# Patient Record
Sex: Male | Born: 1955 | Race: Black or African American | Hispanic: No | Marital: Single | Smoking: Never smoker
Health system: Southern US, Community
[De-identification: ages and names within clinical notes are randomized; demographics above are authoritative.]

## PROBLEM LIST (undated history)

## (undated) DIAGNOSIS — F209 Schizophrenia, unspecified: Secondary | ICD-10-CM

## (undated) DIAGNOSIS — I10 Essential (primary) hypertension: Secondary | ICD-10-CM

## (undated) DIAGNOSIS — K5900 Constipation, unspecified: Secondary | ICD-10-CM

## (undated) HISTORY — PX: APPENDECTOMY: SHX54

## (undated) HISTORY — PX: COLONOSCOPY: SHX174

---

## 2008-09-21 ENCOUNTER — Encounter (INDEPENDENT_AMBULATORY_CARE_PROVIDER_SITE_OTHER): Payer: Self-pay | Admitting: *Deleted

## 2008-11-09 ENCOUNTER — Ambulatory Visit: Payer: Self-pay | Admitting: Internal Medicine

## 2008-11-09 DIAGNOSIS — K5909 Other constipation: Secondary | ICD-10-CM | POA: Insufficient documentation

## 2008-11-09 DIAGNOSIS — K921 Melena: Secondary | ICD-10-CM

## 2008-11-10 ENCOUNTER — Encounter: Payer: Self-pay | Admitting: Internal Medicine

## 2008-11-15 ENCOUNTER — Encounter: Payer: Self-pay | Admitting: Internal Medicine

## 2008-11-24 ENCOUNTER — Ambulatory Visit (HOSPITAL_COMMUNITY): Admission: RE | Admit: 2008-11-24 | Discharge: 2008-11-24 | Payer: Self-pay | Admitting: Internal Medicine

## 2008-11-24 ENCOUNTER — Ambulatory Visit: Payer: Self-pay | Admitting: Internal Medicine

## 2012-04-04 ENCOUNTER — Emergency Department (HOSPITAL_COMMUNITY): Payer: Medicare Other

## 2012-04-04 ENCOUNTER — Emergency Department (HOSPITAL_COMMUNITY)
Admission: EM | Admit: 2012-04-04 | Discharge: 2012-04-04 | Disposition: A | Discharge status: Home / Self Care | Payer: Medicare Other | Attending: Emergency Medicine | Admitting: Emergency Medicine

## 2012-04-04 ENCOUNTER — Encounter (HOSPITAL_COMMUNITY): Payer: Self-pay | Admitting: *Deleted

## 2012-04-04 DIAGNOSIS — Z79899 Other long term (current) drug therapy: Secondary | ICD-10-CM | POA: Insufficient documentation

## 2012-04-04 DIAGNOSIS — R112 Nausea with vomiting, unspecified: Secondary | ICD-10-CM | POA: Insufficient documentation

## 2012-04-04 DIAGNOSIS — K5900 Constipation, unspecified: Secondary | ICD-10-CM | POA: Insufficient documentation

## 2012-04-04 DIAGNOSIS — F209 Schizophrenia, unspecified: Secondary | ICD-10-CM | POA: Insufficient documentation

## 2012-04-04 DIAGNOSIS — R42 Dizziness and giddiness: Secondary | ICD-10-CM | POA: Insufficient documentation

## 2012-04-04 HISTORY — DX: Schizophrenia, unspecified: F20.9

## 2012-04-04 HISTORY — DX: Constipation, unspecified: K59.00

## 2012-04-04 LAB — CBC
HCT: 42.9 % (ref 39.0–52.0)
Hemoglobin: 14.8 g/dL (ref 13.0–17.0)
WBC: 13.3 10*3/uL — ABNORMAL HIGH (ref 4.0–10.5)

## 2012-04-04 LAB — URINALYSIS, ROUTINE W REFLEX MICROSCOPIC
Hgb urine dipstick: NEGATIVE
Leukocytes, UA: NEGATIVE
Nitrite: NEGATIVE
Protein, ur: NEGATIVE mg/dL
Specific Gravity, Urine: <1.005 — ABNORMAL LOW (ref 1.005–1.030)
Urobilinogen, UA: 0.2 mg/dL (ref 0.0–1.0)

## 2012-04-04 LAB — RAPID URINE DRUG SCREEN, HOSP PERFORMED
Barbiturates: NOT DETECTED
Opiates: NOT DETECTED
Tetrahydrocannabinol: NOT DETECTED

## 2012-04-04 LAB — DIFFERENTIAL
Basophils Absolute: 0 10*3/uL (ref 0.0–0.1)
Lymphocytes Relative: 9 % — ABNORMAL LOW (ref 12–46)
Monocytes Absolute: 1.2 10*3/uL — ABNORMAL HIGH (ref 0.1–1.0)
Monocytes Relative: 9 % (ref 3–12)
Neutro Abs: 10.9 10*3/uL — ABNORMAL HIGH (ref 1.7–7.7)

## 2012-04-04 LAB — COMPREHENSIVE METABOLIC PANEL
AST: 21 U/L (ref 0–37)
Alkaline Phosphatase: 63 U/L (ref 39–117)
CO2: 28 mEq/L (ref 19–32)
Chloride: 104 mEq/L (ref 96–112)
Creatinine, Ser: 0.86 mg/dL (ref 0.50–1.35)
GFR calc non Af Amer: >90 mL/min (ref >90)
Potassium: 3.6 mEq/L (ref 3.5–5.1)
Total Bilirubin: 0.4 mg/dL (ref 0.3–1.2)

## 2012-04-04 LAB — GLUCOSE, CAPILLARY: Glucose-Capillary: 95 mg/dL (ref 70–99)

## 2012-04-04 LAB — APTT: aPTT: 26 seconds (ref 24–37)

## 2012-04-04 MED ORDER — METOCLOPRAMIDE HCL 10 MG PO TABS: 10.0000 mg | ORAL_TABLET | Freq: Four times a day (QID) | ORAL | Status: AC | PRN

## 2012-04-04 MED ORDER — MECLIZINE HCL 25 MG PO TABS: 25.0000 mg | ORAL_TABLET | Freq: Four times a day (QID) | ORAL | Status: AC | PRN

## 2012-04-04 MED ORDER — ONDANSETRON HCL 4 MG/2ML IJ SOLN
4.0000 mg | Freq: Once | INTRAMUSCULAR | Status: AC
  Administered 2012-04-04: 4 mg via INTRAVENOUS
  Filled 2012-04-04: qty 2

## 2012-04-04 MED ORDER — METOCLOPRAMIDE HCL 5 MG/ML IJ SOLN
10.0000 mg | Freq: Once | INTRAMUSCULAR | Status: AC
  Administered 2012-04-04: 10 mg via INTRAVENOUS
  Filled 2012-04-04: qty 2

## 2012-04-04 MED ORDER — DIAZEPAM 5 MG PO TABS: 5.0000 mg | ORAL_TABLET | Freq: Four times a day (QID) | ORAL | Status: DC | PRN

## 2012-04-04 NOTE — ED Notes (Signed)
Pt presents to ER with dizziness, unable to maintain balance when standing, N/V when moving, denies diarrhea. No extremity weakness reported.

## 2012-04-04 NOTE — ED Provider Notes (Signed)
History     CSN: [id number]  Arrival date & time 04/04/12  1202   First MD Initiated Contact with Patient 04/04/12 1251      Chief Complaint  Patient presents with  . Dizziness  . Nausea  . Emesis    (Consider location/radiation/quality/duration/timing/severity/associated sxs/prior treatment) HPI This 57 year old has history of schizophrenia and lives in a group home, he was last known well at midnight overnight that he woke up at approximately 2:00 AM to the bathroom and realized he had new onset of gait ataxia with positional vertigo with nausea and has had multiple spells of nonbloody vomiting since he woke up this morning with this new ataxia and vertigo. He is no headache and no change in his poor vision. 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 He has no difficulty with speech or swallowing or understanding. He is no lateralizing weakness or numbness in his arms did not feel uncoordinated when he tries to walk he feels unsteady and has ataxic gait. He is no chest pain shortness breath abdominal pain. He is no trauma. He has not anxious depressed suicidal homicidal or hallucinating. His symptoms are moderately severe there is no treatment prior to arrival. Past Medical History  Diagnosis Date  . Schizophrenia   . Constipation     History reviewed. No pertinent past surgical history.  History reviewed. No pertinent family history.  History  Substance Use Topics  . Smoking status: Never Smoker   . Smokeless tobacco: Not on file  . Alcohol Use: No      Review of Systems 10 Systems reviewed and are negative for acute change except as noted in the HPI. Allergies  Prolixin; Haloperidol lactate; and Thioridazine hcl  Home Medications   Current Outpatient Rx  Name  Route  Sig  Dispense  Refill  . benztropine (COGENTIN) 0.5 MG tablet   Oral   Take 0.5 mg by mouth 2 (two) times daily.         Marland Kitchen docusate sodium (COLACE) 100 MG  capsule   Oral   Take 100 mg by mouth 2 (two) times daily.         Marland Kitchen loratadine (CLARITIN) 10 MG tablet   Oral   Take 10 mg by mouth at bedtime.         Marland Kitchen LORazepam (ATIVAN) 0.5 MG tablet   Oral   Take 0.5 mg by mouth at bedtime.         Marland Kitchen OLANZapine (ZYPREXA) 20 MG tablet   Oral   Take 20 mg by mouth at bedtime.         . diazepam (VALIUM) 5 MG tablet   Oral   Take 1 tablet (5 mg total) by mouth every 6 (six) hours as needed (vertigo).   10 tablet   0   . meclizine (ANTIVERT) 25 MG tablet   Oral   Take 1 tablet (25 mg total) by mouth every 6 (six) hours as needed for dizziness.   20 tablet   0   . metoCLOPramide (REGLAN) 10 MG tablet   Oral   Take 1 tablet (10 mg total) by mouth every 6 (six) hours as needed (nausea).   6 tablet   0     BP 145/86  Pulse 61  Temp(Src) 98.1 F (36.7 C) (Oral)  Ht 5' 5.5" (1.664 m)  Wt 140 lb (63.504 kg)  BMI 22.93 kg/m2  SpO2 99%  Physical Exam  Nursing note and vitals reviewed. Constitutional:  Awake, alert, nontoxic appearance with baseline speech for patient.  HENT:  Head: Atraumatic.  Mouth/Throat: No oropharyngeal exudate.  Eyes: EOM are normal. Pupils are equal, round, and reactive to light. Right eye exhibits no discharge. Left eye exhibits no discharge.  No nystagmus noted extraocular movements, patient is blind in his left eye at baseline, patient has poor vision in his right eye at baseline, patient has baseline ability to count fingers with his right eye, peripheral visual fields are baseline right central and right lateral to his right eye with poor left visual field using his right eye which is baseline according to the patient, he is blind in his left eye which is also baseline  Neck: Neck supple.  Cardiovascular: Normal rate and regular rhythm.   No murmur heard. Pulmonary/Chest: Effort normal and breath sounds normal. No stridor. No respiratory distress. He has no wheezes. He has no rales. He exhibits no  tenderness.  Abdominal: Soft. Bowel sounds are normal. He exhibits no mass. There is no tenderness. There is no rebound.  Musculoskeletal: He exhibits no tenderness.  Baseline ROM, moves extremities with no obvious new focal weakness.  Lymphadenopathy:    He has no cervical adenopathy.  Neurological: He is alert.  Awake, alert, cooperative and aware of situation; motor strength 5/5 bilaterally; sensation normal to light touch bilaterally; no facial asymmetry; tongue midline; major cranial nerves appear intact other than baseline left eye blindness and poor vision right eye; no pronator drift, normal finger to nose bilaterally, gait is ataxic  Skin: No rash noted.  Psychiatric: He has a normal mood and affect.    ED Course  Procedures (including critical care time) ECG: Sinus bradycardia, ventricular rate 54, left axis deviation, left anterior fascicular block, J-point ST elevation in lead V3, no comparison ECG available,  Patient feels much better and is able to walk almost back to baseline in the ED with MRI negative for stroke.  Labs Reviewed  CBC - Abnormal; Notable for the following:    WBC 13.3 (*)    All other components within normal limits  DIFFERENTIAL - Abnormal; Notable for the following:    Neutrophils Relative 82 (*)    Neutro Abs 10.9 (*)    Lymphocytes Relative 9 (*)    Monocytes Absolute 1.2 (*)    All other components within normal limits  COMPREHENSIVE METABOLIC PANEL - Abnormal; Notable for the following:    Glucose, Bld 141 (*)    All other components within normal limits  URINALYSIS, ROUTINE W REFLEX MICROSCOPIC - Abnormal; Notable for the following:    Color, Urine STRAW (*)    Specific Gravity, Urine <1.005 (*)    All other components within normal limits  ETHANOL  PROTIME-INR  APTT  TROPONIN I  URINE RAPID DRUG SCREEN (HOSP PERFORMED)  GLUCOSE, CAPILLARY   No results found.   1. Vertigo       MDM  Patient / Family / Caregiver informed of  clinical course, understand medical decision-making process, and agree with plan.    I doubt any other EMC precluding discharge at this time including, but not necessarily limited to the following:CVA.         Hurman Horn, MD 04/14/12 [phone]

## 2013-07-17 ENCOUNTER — Emergency Department: Payer: Self-pay | Admitting: Emergency Medicine

## 2013-07-17 LAB — COMPREHENSIVE METABOLIC PANEL
ALBUMIN: 3.6 g/dL (ref 3.4–5.0)
ALK PHOS: 82 U/L
ALT: 32 U/L (ref 12–78)
Anion Gap: 4 — ABNORMAL LOW (ref 7–16)
BILIRUBIN TOTAL: 0.7 mg/dL (ref 0.2–1.0)
BUN: 7 mg/dL (ref 7–18)
Calcium, Total: 8.8 mg/dL (ref 8.5–10.1)
Chloride: 109 mmol/L — ABNORMAL HIGH (ref 98–107)
Co2: 28 mmol/L (ref 21–32)
Creatinine: 1 mg/dL (ref 0.60–1.30)
EGFR (Non-African Amer.): >60
GLUCOSE: 104 mg/dL — AB (ref 65–99)
Osmolality: 280 (ref 275–301)
POTASSIUM: 3.9 mmol/L (ref 3.5–5.1)
SGOT(AST): 35 U/L (ref 15–37)
SODIUM: 141 mmol/L (ref 136–145)
Total Protein: 7.9 g/dL (ref 6.4–8.2)

## 2013-07-17 LAB — URINALYSIS, COMPLETE
BACTERIA: NONE SEEN
BLOOD: NEGATIVE
Bilirubin,UR: NEGATIVE
Glucose,UR: NEGATIVE mg/dL (ref 0–75)
Ketone: NEGATIVE
LEUKOCYTE ESTERASE: NEGATIVE
Nitrite: NEGATIVE
PH: 7 (ref 4.5–8.0)
Protein: NEGATIVE
RBC,UR: <1 /HPF (ref 0–5)
Specific Gravity: 1.011 (ref 1.003–1.030)
WBC UR: <1 /HPF (ref 0–5)

## 2013-07-17 LAB — DRUG SCREEN, URINE
AMPHETAMINES, UR SCREEN: NEGATIVE (ref ?–1000)
BENZODIAZEPINE, UR SCRN: NEGATIVE (ref ?–200)
Barbiturates, Ur Screen: NEGATIVE (ref ?–200)
CANNABINOID 50 NG, UR ~~LOC~~: NEGATIVE (ref ?–50)
Cocaine Metabolite,Ur ~~LOC~~: NEGATIVE (ref ?–300)
MDMA (ECSTASY) UR SCREEN: NEGATIVE (ref ?–500)
Methadone, Ur Screen: NEGATIVE (ref ?–300)
Opiate, Ur Screen: NEGATIVE (ref ?–300)
PHENCYCLIDINE (PCP) UR S: NEGATIVE (ref ?–25)
Tricyclic, Ur Screen: NEGATIVE (ref ?–1000)

## 2013-07-17 LAB — CBC
HCT: 45.6 % (ref 40.0–52.0)
HGB: 15.1 g/dL (ref 13.0–18.0)
MCH: 31.9 pg (ref 26.0–34.0)
MCHC: 33.1 g/dL (ref 32.0–36.0)
MCV: 96 fL (ref 80–100)
PLATELETS: 193 10*3/uL (ref 150–440)
RBC: 4.74 10*6/uL (ref 4.40–5.90)
RDW: 14.5 % (ref 11.5–14.5)
WBC: 6.9 10*3/uL (ref 3.8–10.6)

## 2013-07-17 LAB — SALICYLATE LEVEL: Salicylates, Serum: <1.7 mg/dL

## 2013-07-17 LAB — ACETAMINOPHEN LEVEL: Acetaminophen: <2 ug/mL

## 2013-07-17 LAB — ETHANOL: Ethanol %: <0.003 % (ref 0.000–0.080)
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
## 2017-04-03 ENCOUNTER — Ambulatory Visit (INDEPENDENT_AMBULATORY_CARE_PROVIDER_SITE_OTHER): Payer: Medicare Other | Admitting: Psychiatry

## 2017-04-03 ENCOUNTER — Encounter (INDEPENDENT_AMBULATORY_CARE_PROVIDER_SITE_OTHER): Payer: Self-pay

## 2017-04-03 ENCOUNTER — Telehealth (HOSPITAL_COMMUNITY): Payer: Self-pay | Admitting: *Deleted

## 2017-04-03 ENCOUNTER — Encounter (HOSPITAL_COMMUNITY): Payer: Self-pay | Admitting: Psychiatry

## 2017-04-03 VITALS — BP 124/77 | HR 70 | Ht 65.5 in | Wt 138.0 lb

## 2017-04-03 DIAGNOSIS — H548 Legal blindness, as defined in USA: Secondary | ICD-10-CM | POA: Diagnosis not present

## 2017-04-03 DIAGNOSIS — F209 Schizophrenia, unspecified: Secondary | ICD-10-CM

## 2017-04-03 MED ORDER — OLANZAPINE 20 MG PO TABS: 40.0000 mg | ORAL_TABLET | Freq: Every day | ORAL | 0 refills | Status: DC

## 2017-04-03 MED ORDER — LORAZEPAM 1 MG PO TABS: 1.0000 mg | ORAL_TABLET | Freq: Two times a day (BID) | ORAL | 0 refills | Status: DC

## 2017-04-03 MED ORDER — BENZTROPINE MESYLATE 0.5 MG PO TABS: 0.5000 mg | ORAL_TABLET | Freq: Every day | ORAL | 0 refills | Status: DC

## 2017-04-03 NOTE — Telephone Encounter (Signed)
Thanks for the update. Advise them to continue the current dose.

## 2017-04-03 NOTE — Telephone Encounter (Signed)
Dr Modesta Messing Caregiver called stating that the Zyprexa is 40 mg he takes 2 -- 20 mg @ night

## 2017-04-03 NOTE — Patient Instructions (Signed)
1. Continue olanzapine 40 mg at night  2. Continue cogentin 0.5 mg at night 3. Continue lorazepam 1 mg twice a day  4. Return to clinic in three months for 30 mins

## 2017-04-09 ENCOUNTER — Telehealth (HOSPITAL_COMMUNITY): Payer: Self-pay | Admitting: *Deleted

## 2017-04-09 NOTE — Telephone Encounter (Signed)
Prior authorization for Olanzapine received. Submitted online with cover my meds. Awaiting response.

## 2017-04-11 ENCOUNTER — Telehealth (HOSPITAL_COMMUNITY): Payer: Self-pay | Admitting: *Deleted

## 2017-04-11 NOTE — Telephone Encounter (Signed)
Received letter from [facility] for approval of Olanzapine from 02/19/17-04/09/18.
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
## 2017-07-02 ENCOUNTER — Ambulatory Visit (INDEPENDENT_AMBULATORY_CARE_PROVIDER_SITE_OTHER): Payer: Medicare Other | Admitting: Psychiatry

## 2017-07-02 ENCOUNTER — Encounter (HOSPITAL_COMMUNITY): Payer: Self-pay | Admitting: Psychiatry

## 2017-07-02 VITALS — BP 155/93 | HR 90 | Ht 65.5 in | Wt 136.0 lb

## 2017-07-02 DIAGNOSIS — H548 Legal blindness, as defined in USA: Secondary | ICD-10-CM

## 2017-07-02 DIAGNOSIS — Z560 Unemployment, unspecified: Secondary | ICD-10-CM

## 2017-07-02 DIAGNOSIS — F209 Schizophrenia, unspecified: Secondary | ICD-10-CM

## 2017-07-02 DIAGNOSIS — Z811 Family history of alcohol abuse and dependence: Secondary | ICD-10-CM | POA: Diagnosis not present

## 2017-07-02 MED ORDER — BENZTROPINE MESYLATE 0.5 MG PO TABS: 0.5000 mg | ORAL_TABLET | Freq: Every day | ORAL | 0 refills | Status: DC

## 2017-07-02 MED ORDER — LORAZEPAM 1 MG PO TABS: 1.0000 mg | ORAL_TABLET | Freq: Two times a day (BID) | ORAL | 2 refills | Status: DC

## 2017-07-02 MED ORDER — OLANZAPINE 20 MG PO TABS: 40.0000 mg | ORAL_TABLET | Freq: Every day | ORAL | 0 refills | Status: DC

## 2017-07-02 NOTE — Patient Instructions (Addendum)
1. Continue olanzapine 40 mg at night  2. Continue cogentin 0.5 mg at night 3. Continue lorazepam 1 mg twice a day  4. Return to clinic in three months for 30 mins (Please be accompanied by Ms. Long or the staff who takes care of Nicholas Moyer)

## 2017-07-19 ENCOUNTER — Other Ambulatory Visit: Payer: Self-pay

## 2017-07-19 DIAGNOSIS — Z1211 Encounter for screening for malignant neoplasm of colon: Secondary | ICD-10-CM

## 2017-08-05 ENCOUNTER — Ambulatory Visit: Payer: Medicare Other | Admitting: Certified Registered Nurse Anesthetist

## 2017-08-05 ENCOUNTER — Ambulatory Visit
Admission: RE | Admit: 2017-08-05 | Discharge: 2017-08-05 | Disposition: A | Discharge status: Home / Self Care | Payer: Medicare Other | Source: Ambulatory Visit | Attending: Gastroenterology | Admitting: Gastroenterology

## 2017-08-05 ENCOUNTER — Encounter
Admission: RE | Disposition: A | Discharge status: Home / Self Care | Payer: Self-pay | Source: Ambulatory Visit | Attending: Gastroenterology

## 2017-08-05 ENCOUNTER — Other Ambulatory Visit: Payer: Self-pay

## 2017-08-05 ENCOUNTER — Encounter: Payer: Self-pay | Admitting: *Deleted

## 2017-08-05 DIAGNOSIS — D123 Benign neoplasm of transverse colon: Secondary | ICD-10-CM | POA: Insufficient documentation

## 2017-08-05 DIAGNOSIS — F209 Schizophrenia, unspecified: Secondary | ICD-10-CM | POA: Insufficient documentation

## 2017-08-05 DIAGNOSIS — K5900 Constipation, unspecified: Secondary | ICD-10-CM | POA: Diagnosis not present

## 2017-08-05 DIAGNOSIS — K648 Other hemorrhoids: Secondary | ICD-10-CM | POA: Insufficient documentation

## 2017-08-05 DIAGNOSIS — Z79899 Other long term (current) drug therapy: Secondary | ICD-10-CM | POA: Insufficient documentation

## 2017-08-05 DIAGNOSIS — D122 Benign neoplasm of ascending colon: Secondary | ICD-10-CM | POA: Insufficient documentation

## 2017-08-05 DIAGNOSIS — Z1211 Encounter for screening for malignant neoplasm of colon: Secondary | ICD-10-CM | POA: Diagnosis present

## 2017-08-05 HISTORY — PX: COLONOSCOPY WITH PROPOFOL: SHX5780

## 2017-08-05 LAB — URINE DRUG SCREEN, QUALITATIVE (ARMC ONLY)
Amphetamines, Ur Screen: NOT DETECTED
BENZODIAZEPINE, UR SCRN: POSITIVE — AB
Cannabinoid 50 Ng, Ur ~~LOC~~: NOT DETECTED
Cocaine Metabolite,Ur ~~LOC~~: NOT DETECTED
MDMA (Ecstasy)Ur Screen: NOT DETECTED
METHADONE SCREEN, URINE: NOT DETECTED
Opiate, Ur Screen: NOT DETECTED
PHENCYCLIDINE (PCP) UR S: NOT DETECTED
Tricyclic, Ur Screen: NOT DETECTED

## 2017-08-05 SURGERY — COLONOSCOPY WITH PROPOFOL
Anesthesia: General

## 2017-08-05 MED ORDER — LIDOCAINE HCL (CARDIAC) PF 100 MG/5ML IV SOSY
PREFILLED_SYRINGE | INTRAVENOUS | Status: DC | PRN
  Administered 2017-08-05: 50 mg via INTRAVENOUS

## 2017-08-05 MED ORDER — PROPOFOL 10 MG/ML IV BOLUS
INTRAVENOUS | Status: DC | PRN
  Administered 2017-08-05: 60 mg via INTRAVENOUS

## 2017-08-05 MED ORDER — PHENYLEPHRINE HCL 10 MG/ML IJ SOLN
INTRAMUSCULAR | Status: DC | PRN
  Administered 2017-08-05: 100 ug via INTRAVENOUS

## 2017-08-05 MED ORDER — SODIUM CHLORIDE 0.9 % IV SOLN
INTRAVENOUS | Status: DC
  Administered 2017-08-05 (×2): via INTRAVENOUS

## 2017-08-05 MED ORDER — PROPOFOL 500 MG/50ML IV EMUL
INTRAVENOUS | Status: DC | PRN
  Administered 2017-08-05: 185 ug/kg/min via INTRAVENOUS

## 2017-08-05 MED ORDER — PROPOFOL 500 MG/50ML IV EMUL
INTRAVENOUS | Status: AC
  Filled 2017-08-05: qty 50

## 2017-08-05 NOTE — Anesthesia Preprocedure Evaluation (Signed)
Anesthesia Evaluation  Patient identified by MRN, date of birth, ID band Patient awake    Reviewed: Allergy & Precautions, H&P , NPO status , Patient's Chart, lab work & pertinent test results, reviewed documented beta blocker date and time   History of Anesthesia Complications Negative for: history of anesthetic complications  Airway Mallampati: I  TM Distance: >3 FB Neck ROM: full    Dental  (+) Dental Advidsory Given, Missing, Poor Dentition   Pulmonary neg pulmonary ROS,           Cardiovascular Exercise Tolerance: Good negative cardio ROS       Neuro/Psych PSYCHIATRIC DISORDERS Schizophrenia negative neurological ROS     GI/Hepatic negative GI ROS, Neg liver ROS,   Endo/Other  negative endocrine ROS  Renal/GU negative Renal ROS  negative genitourinary   Musculoskeletal   Abdominal   Peds  Hematology negative hematology ROS (+)   Anesthesia Other Findings Past Medical History: No date: Constipation No date: Schizophrenia ([place])   Reproductive/Obstetrics negative OB ROS                             Anesthesia Physical Anesthesia Plan  ASA: II  Anesthesia Plan: General   Post-op Pain Management:    Induction: Intravenous  PONV Risk Score and Plan: 2 and Propofol infusion  Airway Management Planned: Nasal Cannula  Additional Equipment:   Intra-op Plan:   Post-operative Plan:   Informed Consent: I have reviewed the patients History and Physical, chart, labs and discussed the procedure including the risks, benefits and alternatives for the proposed anesthesia with the patient or authorized representative who has indicated his/her understanding and acceptance.   Dental Advisory Given  Plan Discussed with: Anesthesiologist, CRNA and Surgeon  Anesthesia Plan Comments:         Anesthesia Quick Evaluation

## 2017-08-05 NOTE — Anesthesia Procedure Notes (Signed)
Date/Time: 08/05/2017 11:54 AM Performed by: Johnna Acosta, CRNA Pre-anesthesia Checklist: Patient identified, Emergency Drugs available, Suction available, Patient being monitored and Timeout performed Patient Re-evaluated:Patient Re-evaluated prior to induction Oxygen Delivery Method: Nasal cannula Preoxygenation: Pre-oxygenation with 100% oxygen

## 2017-08-05 NOTE — H&P (Signed)
Nicholas Darby, MD [address]  [place]  [place], [place] [address]  Main: [phone]  Fax: [phone] Pager: [phone]  Primary Care Physician:  Carlos American, FNP Primary Gastroenterologist:  Dr. Cephas Moyer  Pre-Procedure History & Physical: HPI:  Nicholas Moyer is a 62 y.o. male is here for an colonoscopy.   Past Medical History:  Diagnosis Date  . Constipation   . Schizophrenia [facility])     Past Surgical History:  Procedure Laterality Date  . APPENDECTOMY    . COLONOSCOPY      Prior to Admission medications   Medication Sig Start Date End Date Taking? Authorizing Provider  benztropine (COGENTIN) 0.5 MG tablet Take 1 tablet (0.5 mg total) by mouth at bedtime. 07/02/17  Yes Norman Clay, MD  docusate sodium (COLACE) 100 MG capsule Take 100 mg by mouth 2 (two) times daily.   Yes [provider]  loratadine (CLARITIN) 10 MG tablet Take 10 mg by mouth daily.    Yes [provider]  LORazepam (ATIVAN) 1 MG tablet Take 1 tablet (1 mg total) by mouth 2 (two) times daily. 07/28/17  Yes Norman Clay, MD  meclizine (ANTIVERT) 25 MG tablet Take 1 tablet (25 mg total) by mouth every 6 (six) hours as needed for dizziness. 04/04/12  Yes Riki Altes, MD  metoCLOPramide (REGLAN) 10 MG tablet Take 1 tablet (10 mg total) by mouth every 6 (six) hours as needed (nausea). 04/04/12  Yes Riki Altes, MD  OLANZapine (ZYPREXA) 20 MG tablet Take 2 tablets (40 mg total) by mouth at bedtime. 07/02/17  Yes Hisada, Elie Goody, MD  polyethylene glycol (MIRALAX / GLYCOLAX) packet Take 17 g by mouth daily.   Yes [provider]    Allergies as of 07/19/2017 - Review Complete 07/02/2017  Allergen Reaction Noted  . Prolixin [fluphenazine] Other (See Comments) 04/04/2012  . Haloperidol lactate Other (See Comments)   . Thioridazine hcl Other (See Comments)     Family History  Problem Relation Age of Onset  . Alcohol abuse Maternal Aunt     Social History    Socioeconomic History  . Marital status: Single    Spouse name: Not on file  . Number of children: Not on file  . Years of education: Not on file  . Highest education level: Not on file  Occupational History  . Not on file  Social Needs  . Financial resource strain: Not on file  . Food insecurity:    Worry: Not on file    Inability: Not on file  . Transportation needs:    Medical: Not on file    Non-medical: Not on file  Tobacco Use  . Smoking status: Never Smoker  . Smokeless tobacco: Never Used  Substance and Sexual Activity  . Alcohol use: Not Currently  . Drug use: Not Currently  . Sexual activity: Never  Lifestyle  . Physical activity:    Days per week: Not on file    Minutes per session: Not on file  . Stress: Not on file  Relationships  . Social connections:    Talks on phone: Not on file    Gets together: Not on file    Attends religious service: Not on file    Active member of club or organization: Not on file    Attends meetings of clubs or organizations: Not on file    Relationship status: Not on file  . Intimate partner violence:    Fear of current or ex partner: Not  on file    Emotionally abused: Not on file    Physically abused: Not on file    Forced sexual activity: Not on file  Other Topics Concern  . Not on file  Social History Narrative  . Not on file    Review of Systems: See HPI, otherwise negative ROS  Physical Exam: BP 132/86   Pulse 78   Temp (!) 96.4 F (35.8 C)   Resp 16   Ht 5' 5.5" (1.664 m)   Wt 145 lb (65.8 kg)   SpO2 100%   BMI 23.76 kg/m  General:   Alert,  pleasant and cooperative in NAD Head:  Normocephalic and atraumatic. Neck:  Supple; no masses or thyromegaly. Lungs:  Clear throughout to auscultation.    Heart:  Regular rate and rhythm. Abdomen:  Soft, nontender and nondistended. Normal bowel sounds, without guarding, and without rebound.   Neurologic:  Alert and  oriented x4;  grossly normal  neurologically.  Impression/Plan: Nicholas Moyer is here for an colonoscopy to be performed for colon cancer screening  Risks, benefits, limitations, and alternatives regarding  colonoscopy have been reviewed with the patient.  Questions have been answered.  All parties agreeable.   Sherri Sear, MD  08/05/2017, 11:39 AM

## 2017-08-05 NOTE — Anesthesia Post-op Follow-up Note (Signed)
Anesthesia QCDR form completed.        

## 2017-08-05 NOTE — Anesthesia Postprocedure Evaluation (Signed)
Anesthesia Post Note  Patient: Nicholas Moyer  Procedure(s) Performed: COLONOSCOPY WITH PROPOFOL (N/A )  Patient location during evaluation: Endoscopy Anesthesia Type: General Level of consciousness: awake and alert Pain management: pain level controlled Vital Signs Assessment: post-procedure vital signs reviewed and stable Respiratory status: spontaneous breathing, nonlabored ventilation, respiratory function stable and patient connected to nasal cannula oxygen Cardiovascular status: blood pressure returned to baseline and stable Postop Assessment: no apparent nausea or vomiting Anesthetic complications: no     Last Vitals:  Vitals:   08/05/17 1236 08/05/17 1246  BP: 125/77 (!) 148/90  Pulse: 71 76  Resp: (!) 21 17  Temp:    SpO2: 100% 100%    Last Pain:  Vitals:   08/05/17 1246  TempSrc:   PainSc: 0-No pain                 Martha Clan

## 2017-08-05 NOTE — Op Note (Addendum)
[facility] Gastroenterology Patient Name: Nicholas Moyer Procedure Date: 08/05/2017 11:52 AM MRN: [id number] Account #: [id number] Date of Birth: [birth year] Admit Type: Outpatient Age: 62 Room: [facility] ENDO ROOM 2 Gender: Male Note Status: Finalized Procedure:            Colonoscopy Indications:          Screening for colorectal malignant neoplasm Providers:            Lin Landsman MD, MD Referring MD:         No Local Md, MD (Referring MD) Medicines:            Monitored Anesthesia Care Complications:        No immediate complications. Estimated blood loss: None. Procedure:            Pre-Anesthesia Assessment:                       - Prior to the procedure, a History and Physical was                        performed, and patient medications and allergies were                        reviewed. The patient is competent. The risks and                        benefits of the procedure and the sedation options and                        risks were discussed with the patient. All questions                        were answered and informed consent was obtained.                        Patient identification and proposed procedure were                        verified by the physician, the nurse, the                        anesthesiologist, the anesthetist and the technician in                        the pre-procedure area in the procedure room in the                        endoscopy suite. Mental Status Examination: alert and                        oriented. Airway Examination: normal oropharyngeal                        airway and neck mobility. Respiratory Examination:                        clear to auscultation. CV Examination: normal.                        Prophylactic Antibiotics: The patient does not require  prophylactic antibiotics. Prior Anticoagulants: The                        patient has taken no previous anticoagulant or               antiplatelet agents. ASA Grade Assessment: II - A                        patient with mild systemic disease. After reviewing the                        risks and benefits, the patient was deemed in                        satisfactory condition to undergo the procedure. The                        anesthesia plan was to use monitored anesthesia care                        (MAC). Immediately prior to administration of                        medications, the patient was re-assessed for adequacy                        to receive sedatives. The heart rate, respiratory rate,                        oxygen saturations, blood pressure, adequacy of                        pulmonary ventilation, and response to care were                        monitored throughout the procedure. The physical status                        of the patient was re-assessed after the procedure.                       After obtaining informed consent, the colonoscope was                        passed under direct vision. Throughout the procedure,                        the patient's blood pressure, pulse, and oxygen                        saturations were monitored continuously. The                        Colonoscope was introduced through the anus and                        advanced to the the cecum, identified by appendiceal                        orifice and ileocecal valve. The colonoscopy was  performed without difficulty. The patient tolerated the                        procedure well. The quality of the bowel preparation                        was evaluated using the BBPS [facility] Bowel Preparation                        Scale) with scores of: Right Colon = 3, Transverse                        Colon = 3 and Left Colon = 3 (entire mucosa seen well                        with no residual staining, small fragments of stool or                        opaque liquid). The total BBPS score equals  9. Findings:      The perianal and digital rectal examinations were normal. Pertinent       negatives include normal sphincter tone and no palpable rectal lesions.      A 7 mm polyp was found in the ascending colon. The polyp was sessile.       The polyp was removed with a hot snare. Resection and retrieval were       complete.      A 20 mm polyp was found in the transverse colon. The polyp was       multi-lobulated and pedunculated. The polyp was removed with a hot       snare. Resection and retrieval were complete.      Non-bleeding internal hemorrhoids were found during retroflexion. The       hemorrhoids were medium-sized.      The exam was otherwise without abnormality. Impression:           - One 7 mm polyp in the ascending colon, removed with a                        hot snare. Complete resection and retrieved                       - One 20 mm polyp in the transverse colon, removed with                        a hot snare. Resected and retrieved.                       - Non-bleeding internal hemorrhoids.                       - The examination was otherwise normal. Recommendation:       - Discharge patient to home (with escort).                       - Resume regular diet today.                       - Continue present medications.                       -  Await pathology results.                       - Repeat colonoscopy in 3 years for surveillance. Procedure Code(s):    --- Professional ---                       [phone], Colonoscopy, flexible; with removal of tumor(s),                        polyp(s), or other lesion(s) by snare technique Diagnosis Code(s):    --- Professional ---                       Z12.11, Encounter for screening for malignant neoplasm                        of colon                       D12.2, Benign neoplasm of ascending colon                       D12.3, Benign neoplasm of transverse colon (hepatic                        flexure or splenic flexure)                        K64.8, Other hemorrhoids CPT copyright 2017 American Medical Association. All rights reserved. The codes documented in this report are preliminary and upon coder review may  be revised to meet current compliance requirements. Dr. Ulyess Mort Lin Landsman MD, MD 08/05/2017 12:25:28 PM This report has been signed electronically. Number of Addenda: 0 Note Initiated On: 08/05/2017 11:52 AM Scope Withdrawal Time: 0 hours 18 minutes 46 seconds  Total Procedure Duration: 0 hours 21 minutes 49 seconds       [facility]

## 2017-08-05 NOTE — Transfer of Care (Signed)
Immediate Anesthesia Transfer of Care Note  Patient: Gurjot Premo  Procedure(s) Performed: COLONOSCOPY WITH PROPOFOL (N/A )  Patient Location: PACU  Anesthesia Type:General  Level of Consciousness: sedated  Airway & Oxygen Therapy: Patient Spontanous Breathing and Patient connected to nasal cannula oxygen  Post-op Assessment: Report given to RN and Post -op Vital signs reviewed and stable  Post vital signs: Reviewed and stable  Last Vitals:  Vitals Value Taken Time  BP 102/73 08/05/2017 12:27 PM  Temp 36.1 C 08/05/2017 12:27 PM  Pulse 88 08/05/2017 12:27 PM  Resp 12 08/05/2017 12:27 PM  SpO2 99 % 08/05/2017 12:27 PM  Vitals shown include unvalidated device data.  Last Pain:  Vitals:   08/05/17 1227  TempSrc: Tympanic  PainSc:          Complications: No apparent anesthesia complications

## 2017-08-06 ENCOUNTER — Encounter: Payer: Self-pay | Admitting: Gastroenterology

## 2017-08-06 LAB — SURGICAL PATHOLOGY

## 2017-08-07 ENCOUNTER — Encounter: Payer: Self-pay | Admitting: Gastroenterology

## 2017-09-23 NOTE — Progress Notes (Signed)
BH MD/PA/NP OP Progress Note  10/07/2017 11:01 AM Nicholas Moyer  MRN:  [id number]  Chief Complaint:  Chief Complaint    Follow-up; Schizophrenia     HPI:  Patient presents for follow-up appointment for schizophrenia.  He states that he had decreased vision on his right eye and his left eye is blind.  He has follow-up appointment with his ophthalmologist.  He states that he has been doing well otherwise. He got contacted by his one of his sisters in [place]. He talks with her every few months. He enjoys writing songs and stories.  He states in the room most of the time.  He reports good relationship was Konrad Dolores, his roommate.  He denies insomnia.  He denies feeling depressed.  He has good energy.  He denies SI, HI, AH, VH. He denies feeling anxious. He denies panic attacks. He denies paranoia.  He denies ideas of reference.   Per letter from Ms.  Elza Rafter He has been doing fine. No new complaints except constipation. He enjoys writing songs, stories.   Wt Readings from Last 3 Encounters:  10/07/17 137 lb (62.1 kg)  08/05/17 145 lb (65.8 kg)  07/02/17 136 lb (61.7 kg)   Per PMP,  Lorazepam filled on 10/03/2017    Visit Diagnosis:    ICD-10-CM   1. Schizophrenia, unspecified type ([place]) F20.9     Past Psychiatric History: Please see initial evaluation for full details. I have reviewed the history. No updates at this time.     Past Medical History:  Past Medical History:  Diagnosis Date  . Constipation   . Schizophrenia [facility])     Past Surgical History:  Procedure Laterality Date  . APPENDECTOMY    . COLONOSCOPY    . COLONOSCOPY WITH PROPOFOL N/A 08/05/2017   Procedure: COLONOSCOPY WITH PROPOFOL;  Surgeon: Lin Landsman, MD;  Location: [facility] ENDOSCOPY;  Service: Gastroenterology;  Laterality: N/A;    Family Psychiatric History: Please see initial evaluation for full details. I have reviewed the history. No updates at this time.     Family History:  Family History   Problem Relation Age of Onset  . Alcohol abuse Maternal Aunt     Social History:  Social History   Socioeconomic History  . Marital status: Single    Spouse name: Not on file  . Number of children: Not on file  . Years of education: Not on file  . Highest education level: Not on file  Occupational History  . Not on file  Social Needs  . Financial resource strain: Not on file  . Food insecurity:    Worry: Not on file    Inability: Not on file  . Transportation needs:    Medical: Not on file    Non-medical: Not on file  Tobacco Use  . Smoking status: Never Smoker  . Smokeless tobacco: Never Used  Substance and Sexual Activity  . Alcohol use: Not Currently  . Drug use: Not Currently  . Sexual activity: Never  Lifestyle  . Physical activity:    Days per week: Not on file    Minutes per session: Not on file  . Stress: Not on file  Relationships  . Social connections:    Talks on phone: Not on file    Gets together: Not on file    Attends religious service: Not on file    Active member of club or organization: Not on file    Attends meetings of clubs or organizations: Not  on file    Relationship status: Not on file  Other Topics Concern  . Not on file  Social History Narrative  . Not on file    Allergies:  Allergies  Allergen Reactions  . Prolixin [Fluphenazine] Other (See Comments)    Pt states his muscles lock up  . Haloperidol Lactate Other (See Comments)    Muscles lock up.  . Thioridazine Hcl Other (See Comments)    Induces heart attack.    Metabolic Disorder Labs: No results found for: HGBA1C, MPG No results found for: PROLACTIN No results found for: CHOL, TRIG, HDL, CHOLHDL, VLDL, LDLCALC No results found for: TSH  Therapeutic Level Labs: No results found for: LITHIUM No results found for: VALPROATE No components found for:  CBMZ  Current Medications: Current Outpatient Medications  Medication Sig Dispense Refill  . docusate sodium (COLACE)  100 MG capsule Take 100 mg by mouth 2 (two) times daily.    Marland Kitchen loratadine (CLARITIN) 10 MG tablet Take 10 mg by mouth daily.     Derrill Memo ON 11/03/2017] LORazepam (ATIVAN) 1 MG tablet Take 1 tablet (1 mg total) by mouth 2 (two) times daily. 60 tablet 2  . OLANZapine (ZYPREXA) 20 MG tablet Take 2 tablets (40 mg total) by mouth at bedtime. 180 tablet 0  . polyethylene glycol (MIRALAX / GLYCOLAX) packet Take 17 g by mouth daily.    . meclizine (ANTIVERT) 25 MG tablet Take 1 tablet (25 mg total) by mouth every 6 (six) hours as needed for dizziness. (Patient not taking: Reported on 10/07/2017) 20 tablet 0  . metoCLOPramide (REGLAN) 10 MG tablet Take 1 tablet (10 mg total) by mouth every 6 (six) hours as needed (nausea). (Patient not taking: Reported on 10/07/2017) 6 tablet 0   No current facility-administered medications for this visit.      Musculoskeletal: Strength & Muscle Tone: within normal limits Gait & Station: unsteady due to visual loss Patient leans: N/A  Psychiatric Specialty Exam: Review of Systems  Eyes:       Decreased vision on bilateral eyes (left blind per patient)  Psychiatric/Behavioral: Negative for depression, hallucinations, memory loss, substance abuse and suicidal ideas. The patient is not nervous/anxious and does not have insomnia.   All other systems reviewed and are negative.   Blood pressure 128/73, pulse 60, height 5' 5.5" (1.664 m), weight 137 lb (62.1 kg), SpO2 97 %.Body mass index is 22.45 kg/m.  General Appearance: Fairly Groomed  Eye Contact:  Fair  Speech:  Clear and Coherent  Volume:  Normal  Mood:  "good"  Affect:  Appropriate and Restricted  Thought Process:  Coherent  Orientation:  Full (Time, Place, and Person)  Thought Content: Logical , concrete  Suicidal Thoughts:  No  Homicidal Thoughts:  No  Memory:  Immediate;   Good  Judgement:  Fair  Insight:  Shallow  Psychomotor Activity:  Normal  Concentration:  Concentration: Good and Attention Span:  Good  Recall:  Good  Fund of Knowledge: Good  Language: Good  Akathisia:  No  Handed:  Right  AIMS (if indicated): not done  Assets:  Communication Skills Desire for Improvement  ADL's:  Intact  Cognition: WNL  Sleep:  good   Screenings:   Assessment and Plan:  Diontre Harps is a 62 y.o. year old male with a history of schizophrenia, legally blind, who presents for follow up appointment for Schizophrenia, unspecified type ([place])  # Schizophrenia Patient is calm through the entire interview and demonstrates linear thought  process, which has been consistent since the initial visit.  Collateral from the staff reports no concern for his behavior.  Will continue olanzapine to target schizophrenia. Discussed risks, including, but not limited to metabolic side effect. Will consider tapering down of this medication at the next visit given he has been on higher dose than recommended. Will discontinue cogentin to avoid polypharmacy. Will continue lorazepam for anxiety. Discussed risk of drowsiness, oversedation. Discussed behavioral activation.   Plan 1. Continue olanzapine 40 mg at night  2. Discontinue cogentin 3. Continue lorazepam 1 mg twice a day  4. Return to clinic in three months for 15 mins  The patient demonstrates the following risk factors for suicide: Chronic risk factors for suicide include:psychiatric disorder ofschizophrenia. Acute risk factorsfor suicide include: unemployment. Protective factorsfor this patient include: positive social support. Considering these factors, the overall suicide risk at this point appears to below. Patientisappropriate for outpatient follow up.  The duration of this appointment visit was 30 minutes of face-to-face time with the patient.  Greater than 50% of this time was spent in counseling, explanation of  diagnosis, planning of further management, and coordination of care.  Norman Clay, MD 10/07/2017, 11:01 AM

## 2017-10-07 ENCOUNTER — Encounter (HOSPITAL_COMMUNITY): Payer: Self-pay | Admitting: Psychiatry

## 2017-10-07 ENCOUNTER — Ambulatory Visit (INDEPENDENT_AMBULATORY_CARE_PROVIDER_SITE_OTHER): Payer: Medicare Other | Admitting: Psychiatry

## 2017-10-07 VITALS — BP 128/73 | HR 60 | Ht 65.5 in | Wt 137.0 lb

## 2017-10-07 DIAGNOSIS — Z811 Family history of alcohol abuse and dependence: Secondary | ICD-10-CM | POA: Diagnosis not present

## 2017-10-07 DIAGNOSIS — F209 Schizophrenia, unspecified: Secondary | ICD-10-CM

## 2017-10-07 MED ORDER — LORAZEPAM 1 MG PO TABS: 1.0000 mg | ORAL_TABLET | Freq: Two times a day (BID) | ORAL | 2 refills | Status: DC

## 2017-10-07 MED ORDER — OLANZAPINE 20 MG PO TABS: 40.0000 mg | ORAL_TABLET | Freq: Every day | ORAL | 0 refills | Status: DC

## 2017-10-07 NOTE — Patient Instructions (Addendum)
1. Continue olanzapine 40 mg at night  2. Discontinue cogentin 3. Continue lorazepam 1 mg twice a day  4. Return to clinic in three months for 15 mins (appreciate a note from Ms. Long or the staff in regards to how Nicholas Moyer is doing until the next visit)

## 2017-12-31 NOTE — Progress Notes (Signed)
[place] MD/PA/NP OP Progress Note  01/07/2018 10:25 AM Nicholas Moyer  MRN:  [id number]  Chief Complaint:  Chief Complaint    Follow-up; Schizophrenia     HPI:  Patient presents for follow-up appointment for schizophrenia.  He states that he has been feeling pretty good.  He has been trying to come out with the next all he to write on the back.  He denies feeling depressed, stating that he is busy with writing.  When he is asked about the reason for wearing his hood, he states that his neck is cold.  Although he occasionally feels that somebody might get him, he is not bothered by it.  He reports good sleep. When Lelon Frohlich told him that he wakes up at night, he states that he wakes up with sweat due to issues with fan. He has good energy.  He denies SI, HI, AH, VH.  He denies feeling anxious.  He denies ideas of reference or any paranoia. Of note, at the end of the interview, he asks colonoscopy to be repeated every year rather than waiting for another three years as he is concerned of cancer; he is recommended to discuss with his PCP.  Ms. Laverta Baltimore presents to the interview.  She head from his roommate that Keefe wakes up at around 1 am and has difficulty with sleep. No other safety concern.  Wt Readings from Last 3 Encounters:  01/07/18 142 lb (64.4 kg)  10/07/17 137 lb (62.1 kg)  08/05/17 145 lb (65.8 kg)    Wt Readings from Last 3 Encounters:  01/07/18 142 lb (64.4 kg)  10/07/17 137 lb (62.1 kg)  08/05/17 145 lb (65.8 kg)    I have utilized the [place] Controlled Substances Reporting System (PMP AWARxE) to confirm adherence regarding the patient's medication. My review reveals appropriate prescription fills.   Visit Diagnosis:    ICD-10-CM   1. Schizophrenia, unspecified type ([place]) F20.9     Past Psychiatric History: Please see initial evaluation for full details. I have reviewed the history. No updates at this time.     Past Medical History:  Past Medical History:  Diagnosis Date  .  Constipation   . Schizophrenia [facility])     Past Surgical History:  Procedure Laterality Date  . APPENDECTOMY    . COLONOSCOPY    . COLONOSCOPY WITH PROPOFOL N/A 08/05/2017   Procedure: COLONOSCOPY WITH PROPOFOL;  Surgeon: Lin Landsman, MD;  Location: [facility] ENDOSCOPY;  Service: Gastroenterology;  Laterality: N/A;    Family Psychiatric History: Please see initial evaluation for full details. I have reviewed the history. No updates at this time.   Family History:  Family History  Problem Relation Age of Onset  . Alcohol abuse Maternal Aunt     Social History:  Social History   Socioeconomic History  . Marital status: Single    Spouse name: Not on file  . Number of children: Not on file  . Years of education: Not on file  . Highest education level: Not on file  Occupational History  . Not on file  Social Needs  . Financial resource strain: Not on file  . Food insecurity:    Worry: Not on file    Inability: Not on file  . Transportation needs:    Medical: Not on file    Non-medical: Not on file  Tobacco Use  . Smoking status: Never Smoker  . Smokeless tobacco: Never Used  Substance and Sexual Activity  . Alcohol use: Not Currently  .  Drug use: Not Currently  . Sexual activity: Never  Lifestyle  . Physical activity:    Days per week: Not on file    Minutes per session: Not on file  . Stress: Not on file  Relationships  . Social connections:    Talks on phone: Not on file    Gets together: Not on file    Attends religious service: Not on file    Active member of club or organization: Not on file    Attends meetings of clubs or organizations: Not on file    Relationship status: Not on file  Other Topics Concern  . Not on file  Social History Narrative  . Not on file    Allergies:  Allergies  Allergen Reactions  . Prolixin [Fluphenazine] Other (See Comments)    Pt states his muscles lock up  . Haloperidol Lactate Other (See Comments)    Muscles lock up.   . Thioridazine Hcl Other (See Comments)    Induces heart attack.    Metabolic Disorder Labs: No results found for: HGBA1C, MPG No results found for: PROLACTIN No results found for: CHOL, TRIG, HDL, CHOLHDL, VLDL, LDLCALC No results found for: TSH  Therapeutic Level Labs: No results found for: LITHIUM No results found for: VALPROATE No components found for:  CBMZ  Current Medications: Current Outpatient Medications  Medication Sig Dispense Refill  . docusate sodium (COLACE) 100 MG capsule Take 100 mg by mouth 2 (two) times daily.    Marland Kitchen loratadine (CLARITIN) 10 MG tablet Take 10 mg by mouth daily.     Marland Kitchen LORazepam (ATIVAN) 1 MG tablet Take 1 tablet (1 mg total) by mouth 2 (two) times daily. 60 tablet 2  . meclizine (ANTIVERT) 25 MG tablet Take 1 tablet (25 mg total) by mouth every 6 (six) hours as needed for dizziness. 20 tablet 0  . metoCLOPramide (REGLAN) 10 MG tablet Take 1 tablet (10 mg total) by mouth every 6 (six) hours as needed (nausea). 6 tablet 0  . OLANZapine (ZYPREXA) 20 MG tablet Take 2 tablets (40 mg total) by mouth at bedtime. 180 tablet 0  . polyethylene glycol (MIRALAX / GLYCOLAX) packet Take 17 g by mouth daily.    Marland Kitchen OLANZapine (ZYPREXA) 15 MG tablet Take total of 35 mg at night (20 mg + 15 mg) 90 tablet 0  . OLANZapine (ZYPREXA) 20 MG tablet Take total of 35 mg at night (20 mg + 15 mg) 90 tablet 0  . traZODone (DESYREL) 50 MG tablet 25-50 mg at night as needed for sleep 90 tablet 0   No current facility-administered medications for this visit.      Musculoskeletal: Strength & Muscle Tone: within normal limits Gait & Station: normal Patient leans: N/A  Psychiatric Specialty Exam: Review of Systems  Psychiatric/Behavioral: Negative for depression, hallucinations, memory loss, substance abuse and suicidal ideas. The patient has insomnia. The patient is not nervous/anxious.   All other systems reviewed and are negative.   Blood pressure (!) 138/92, pulse 62,  height 5' 5.5" (1.664 m), weight 142 lb (64.4 kg), SpO2 98 %.Body mass index is 23.27 kg/m.  General Appearance: Fairly Groomed  Eye Contact:  Minimal  Speech:  Clear and Coherent  Volume:  Normal  Mood:  "good"  Affect:  Flat  Thought Process:  Coherent  Orientation:  Full (Time, Place, and Person)  Thought Content: Logical   Suicidal Thoughts:  No  Homicidal Thoughts:  No  Memory:  Immediate;   Good  Judgement:  Fair  Insight:  Shallow  Psychomotor Activity:  Normal  Concentration:  Concentration: Good and Attention Span: Good  Recall:  Good  Fund of Knowledge: Good  Language: Good  Akathisia:  No  Handed:  Right  AIMS (if indicated): not done  Assets:  Communication Skills Desire for Improvement  ADL's:  Intact  Cognition: WNL  Sleep:  Poor per staff   Screenings:   Assessment and Plan:  Maverick Dieudonne is a 62 y.o. year old male with a history of schizophrenia, legally blind, who presents for follow up appointment for Schizophrenia, unspecified type ([facility])  # Schizophrenia Patient is calm through the entire interview, and he demonstrates linear thought process, which has been consistent since the initial visit.  Collateral from the staff reports no concern of his behavior.  Will slowly taper down on olanzapine to target schizophrenia given he is on higher than recommended dose.  Discussed potential metabolic side effect.  Will continue clonazepam as needed for anxiety.  Discussed risk of drowsiness and oversedation.  Will start trazodone as needed for insomnia; recommended to try this medication after improving sleep hygiene.  Discussed behavioral activation.   Plan I have reviewed and updated plans as below 1. Decrease olanzapine 35 mg at night  2. Continue lorazepam 1 mg twice a day  3. Continue Trazodone 25-50 mg at night as needed for sleep 4. Return to clinic in three months for 15 mins Labs in July- to be scanned (lipid, glucose)  The patient demonstrates the  following risk factors for suicide: Chronic risk factors for suicide include:psychiatric disorder ofschizophrenia. Acute risk factorsfor suicide include: unemployment. Protective factorsfor this patient include: positive social support. Considering these factors, the overall suicide risk at this point appears to below. Patientisappropriate for outpatient follow up.  The duration of this appointment visit was 30 minutes of face-to-face time with the patient.  Greater than 50% of this time was spent in counseling, explanation of  diagnosis, planning of further management, and coordination of care.  Norman Clay, MD 01/07/2018, 10:25 AM

## 2018-01-07 ENCOUNTER — Ambulatory Visit (INDEPENDENT_AMBULATORY_CARE_PROVIDER_SITE_OTHER): Payer: Medicare Other | Admitting: Psychiatry

## 2018-01-07 ENCOUNTER — Encounter (HOSPITAL_COMMUNITY): Payer: Self-pay | Admitting: Psychiatry

## 2018-01-07 VITALS — BP 138/92 | HR 62 | Ht 65.5 in | Wt 142.0 lb

## 2018-01-07 DIAGNOSIS — F209 Schizophrenia, unspecified: Secondary | ICD-10-CM | POA: Diagnosis not present

## 2018-01-07 MED ORDER — OLANZAPINE 20 MG PO TABS: ORAL_TABLET | ORAL | 0 refills | Status: DC

## 2018-01-07 MED ORDER — TRAZODONE HCL 50 MG PO TABS: ORAL_TABLET | ORAL | 0 refills | Status: DC

## 2018-01-07 MED ORDER — OLANZAPINE 15 MG PO TABS: ORAL_TABLET | ORAL | 0 refills | Status: DC

## 2018-01-07 MED ORDER — LORAZEPAM 1 MG PO TABS: 1.0000 mg | ORAL_TABLET | Freq: Two times a day (BID) | ORAL | 2 refills | Status: DC

## 2018-01-07 NOTE — Patient Instructions (Addendum)
1. Decrease olanzapine 35 mg at night (20 mg + 15 mg) 2. Continue lorazepam 1 mg twice a day  3. Continue Trazodone 25-50 mg at night as needed for sleep 4. Return to clinic in three months for 15 mins

## 2018-03-04 ENCOUNTER — Other Ambulatory Visit (HOSPITAL_COMMUNITY): Payer: Self-pay | Admitting: Psychiatry

## 2018-03-04 MED ORDER — OLANZAPINE 15 MG PO TABS: ORAL_TABLET | ORAL | 0 refills | Status: DC

## 2018-03-10 ENCOUNTER — Other Ambulatory Visit (HOSPITAL_COMMUNITY): Payer: Self-pay | Admitting: Psychiatry

## 2018-03-10 MED ORDER — OLANZAPINE 20 MG PO TABS: ORAL_TABLET | ORAL | 0 refills | Status: DC

## 2018-04-03 NOTE — Progress Notes (Deleted)
[place] MD/PA/NP OP Progress Note  04/03/2018 3:26 PM Nicholas Moyer  MRN:  [id number]  Chief Complaint:  HPI: *** Visit Diagnosis: No diagnosis found.  Past Psychiatric History: Please see initial evaluation for full details. I have reviewed the history. No updates at this time.     Past Medical History:  Past Medical History:  Diagnosis Date  . Constipation   . Schizophrenia [facility])     Past Surgical History:  Procedure Laterality Date  . APPENDECTOMY    . COLONOSCOPY    . COLONOSCOPY WITH PROPOFOL N/A 08/05/2017   Procedure: COLONOSCOPY WITH PROPOFOL;  Surgeon: Lin Landsman, MD;  Location: [facility] ENDOSCOPY;  Service: Gastroenterology;  Laterality: N/A;    Family Psychiatric History: Please see initial evaluation for full details. I have reviewed the history. No updates at this time.     Family History:  Family History  Problem Relation Age of Onset  . Alcohol abuse Maternal Aunt     Social History:  Social History   Socioeconomic History  . Marital status: Single    Spouse name: Not on file  . Number of children: Not on file  . Years of education: Not on file  . Highest education level: Not on file  Occupational History  . Not on file  Social Needs  . Financial resource strain: Not on file  . Food insecurity:    Worry: Not on file    Inability: Not on file  . Transportation needs:    Medical: Not on file    Non-medical: Not on file  Tobacco Use  . Smoking status: Never Smoker  . Smokeless tobacco: Never Used  Substance and Sexual Activity  . Alcohol use: Not Currently  . Drug use: Not Currently  . Sexual activity: Never  Lifestyle  . Physical activity:    Days per week: Not on file    Minutes per session: Not on file  . Stress: Not on file  Relationships  . Social connections:    Talks on phone: Not on file    Gets together: Not on file    Attends religious service: Not on file    Active member of club or organization: Not on file    Attends  meetings of clubs or organizations: Not on file    Relationship status: Not on file  Other Topics Concern  . Not on file  Social History Narrative  . Not on file    Allergies:  Allergies  Allergen Reactions  . Prolixin [Fluphenazine] Other (See Comments)    Pt states his muscles lock up  . Haloperidol Lactate Other (See Comments)    Muscles lock up.  . Thioridazine Hcl Other (See Comments)    Induces heart attack.    Metabolic Disorder Labs: No results found for: HGBA1C, MPG No results found for: PROLACTIN No results found for: CHOL, TRIG, HDL, CHOLHDL, VLDL, LDLCALC No results found for: TSH  Therapeutic Level Labs: No results found for: LITHIUM No results found for: VALPROATE No components found for:  CBMZ  Current Medications: Current Outpatient Medications  Medication Sig Dispense Refill  . docusate sodium (COLACE) 100 MG capsule Take 100 mg by mouth 2 (two) times daily.    Marland Kitchen loratadine (CLARITIN) 10 MG tablet Take 10 mg by mouth daily.     Marland Kitchen LORazepam (ATIVAN) 1 MG tablet Take 1 tablet (1 mg total) by mouth 2 (two) times daily. 60 tablet 2  . meclizine (ANTIVERT) 25 MG tablet Take 1 tablet (25  mg total) by mouth every 6 (six) hours as needed for dizziness. 20 tablet 0  . metoCLOPramide (REGLAN) 10 MG tablet Take 1 tablet (10 mg total) by mouth every 6 (six) hours as needed (nausea). 6 tablet 0  . OLANZapine (ZYPREXA) 15 MG tablet Take total of 35 mg at night (20 mg + 15 mg) 90 tablet 0  . OLANZapine (ZYPREXA) 20 MG tablet Take 2 tablets (40 mg total) by mouth at bedtime. 180 tablet 0  . [START ON 04/08/2018] OLANZapine (ZYPREXA) 20 MG tablet Take total of 35 mg at night (20 mg + 15 mg) 90 tablet 0  . polyethylene glycol (MIRALAX / GLYCOLAX) packet Take 17 g by mouth daily.    . traZODone (DESYREL) 50 MG tablet 25-50 mg at night as needed for sleep 90 tablet 0   No current facility-administered medications for this visit.      Musculoskeletal: Strength & Muscle  Tone: within normal limits Gait & Station: normal Patient leans: N/A  Psychiatric Specialty Exam: ROS  There were no vitals taken for this visit.There is no height or weight on file to calculate BMI.  General Appearance: Fairly Groomed  Eye Contact:  Good  Speech:  Clear and Coherent  Volume:  Normal  Mood:  {BHH MOOD:22306}  Affect:  {Affect (PAA):22687}  Thought Process:  Coherent  Orientation:  Full (Time, Place, and Person)  Thought Content: Logical   Suicidal Thoughts:  {ST/HT (PAA):22692}  Homicidal Thoughts:  {ST/HT (PAA):22692}  Memory:  Immediate;   Good  Judgement:  {Judgement (PAA):22694}  Insight:  {Insight (PAA):22695}  Psychomotor Activity:  Normal  Concentration:  Concentration: Good and Attention Span: Good  Recall:  Good  Fund of Knowledge: Good  Language: Good  Akathisia:  No  Handed:  Right  AIMS (if indicated): not done  Assets:  Communication Skills Desire for Improvement  ADL's:  Intact  Cognition: WNL  Sleep:  {BHH GOOD/FAIR/POOR:22877}   Screenings:   Assessment and Plan:  Nicholas Moyer is a 63 y.o. year old male with a history of schizophrenia, legally blind, who presents for follow up appointment for No diagnosis found.  # Schizophrenia  Patient is calm through the entire interview, and he demonstrates linear thought process, which has been consistent since the initial visit.  Collateral from the staff reports no concern of his behavior.  Will slowly taper down on olanzapine to target schizophrenia given he is on higher than recommended dose.  Discussed potential metabolic side effect.  Will continue clonazepam as needed for anxiety.  Discussed risk of drowsiness and oversedation.  Will start trazodone as needed for insomnia; recommended to try this medication after improving sleep hygiene.  Discussed behavioral activation.   Plan  1. Decrease olanzapine 35 mg at night  2. Continue lorazepam 1 mg twice a day  3. Continue Trazodone 25-50 mg  at night as needed for sleep 4. Return to clinic in three months for 15 mins Labs in July- to be scanned (lipid, glucose)  The patient demonstrates the following risk factors for suicide: Chronic risk factors for suicide include:psychiatric disorder ofschizophrenia. Acute risk factorsfor suicide include: unemployment. Protective factorsfor this patient include: positive social support. Considering these factors, the overall suicide risk at this point appears to below. Patientisappropriate for outpatient follow up.  Norman Clay, MD 04/03/2018, 3:26 PM

## 2018-04-09 ENCOUNTER — Ambulatory Visit (HOSPITAL_COMMUNITY): Payer: Medicare Other | Admitting: Psychiatry

## 2018-04-10 ENCOUNTER — Ambulatory Visit (HOSPITAL_COMMUNITY): Payer: Medicare Other | Admitting: Psychiatry

## 2018-04-28 NOTE — Progress Notes (Signed)
[place] MD/PA/NP OP Progress Note  05/01/2018 4:17 PM Nicholas Moyer  MRN:  [id number]  Chief Complaint:  Chief Complaint    Follow-up; Schizophrenia     HPI:  Patient presents for follow-up appointment for schizophrenia.  He states that he has been doing well.  He wrote a book this morning.  He starts his routine by brushing teeth and washing his face.  He stays inside most of the time as he is unable to see.  He can see colors.  He agrees that he may try to have some fresh air (if the staff allows it) as it was "great" on the way here.  He talks about Bible.  He used to enjoy books, although he has not been able to do so due to his decreased vision.  He sleeps well.  He denies feeling depressed.  He denies anxiety or panic attacks.  He denies AH, VH.  He feels safe at the group home.  He denies paranoia. He has good appetite.   Clonazepam filled on 04/05/2018     Wt Readings from Last 3 Encounters:  05/01/18 148 lb (67.1 kg)  01/07/18 142 lb (64.4 kg)  10/07/17 137 lb (62.1 kg)    Visit Diagnosis:    ICD-10-CM   1. Schizophrenia, unspecified type ([place]) F20.9     Past Psychiatric History: Please see initial evaluation for full details. I have reviewed the history. No updates at this time.     Past Medical History:  Past Medical History:  Diagnosis Date  . Constipation   . Schizophrenia [facility])     Past Surgical History:  Procedure Laterality Date  . APPENDECTOMY    . COLONOSCOPY    . COLONOSCOPY WITH PROPOFOL N/A 08/05/2017   Procedure: COLONOSCOPY WITH PROPOFOL;  Surgeon: Lin Landsman, MD;  Location: [facility] ENDOSCOPY;  Service: Gastroenterology;  Laterality: N/A;    Family Psychiatric History: Please see initial evaluation for full details. I have reviewed the history. No updates at this time.     Family History:  Family History  Problem Relation Age of Onset  . Alcohol abuse Maternal Aunt     Social History:  Social History   Socioeconomic History  . Marital  status: Single    Spouse name: Not on file  . Number of children: Not on file  . Years of education: Not on file  . Highest education level: Not on file  Occupational History  . Not on file  Social Needs  . Financial resource strain: Not on file  . Food insecurity:    Worry: Not on file    Inability: Not on file  . Transportation needs:    Medical: Not on file    Non-medical: Not on file  Tobacco Use  . Smoking status: Never Smoker  . Smokeless tobacco: Never Used  Substance and Sexual Activity  . Alcohol use: Not Currently  . Drug use: Not Currently  . Sexual activity: Never  Lifestyle  . Physical activity:    Days per week: Not on file    Minutes per session: Not on file  . Stress: Not on file  Relationships  . Social connections:    Talks on phone: Not on file    Gets together: Not on file    Attends religious service: Not on file    Active member of club or organization: Not on file    Attends meetings of clubs or organizations: Not on file    Relationship status: Not on  file  Other Topics Concern  . Not on file  Social History Narrative  . Not on file    Allergies:  Allergies  Allergen Reactions  . Prolixin [Fluphenazine] Other (See Comments)    Pt states his muscles lock up  . Haloperidol Lactate Other (See Comments)    Muscles lock up.  . Thioridazine Hcl Other (See Comments)    Induces heart attack.    Metabolic Disorder Labs: No results found for: HGBA1C, MPG No results found for: PROLACTIN No results found for: CHOL, TRIG, HDL, CHOLHDL, VLDL, LDLCALC No results found for: TSH  Therapeutic Level Labs: No results found for: LITHIUM No results found for: VALPROATE No components found for:  CBMZ  Current Medications: Current Outpatient Medications  Medication Sig Dispense Refill  . docusate sodium (COLACE) 100 MG capsule Take 100 mg by mouth 2 (two) times daily.    Marland Kitchen loratadine (CLARITIN) 10 MG tablet Take 10 mg by mouth daily.     Marland Kitchen LORazepam  (ATIVAN) 1 MG tablet Take 1 tablet (1 mg total) by mouth 2 (two) times daily. 60 tablet 2  . OLANZapine (ZYPREXA) 15 MG tablet Take total of 35 mg at night (20 mg + 15 mg) 90 tablet 0  . polyethylene glycol (MIRALAX / GLYCOLAX) packet Take 17 g by mouth daily.    . traZODone (DESYREL) 50 MG tablet 25-50 mg at night as needed for sleep 90 tablet 0  . traZODone (DESYREL) 50 MG tablet Take 50 mg by mouth at bedtime as needed for sleep.    . meclizine (ANTIVERT) 25 MG tablet Take 1 tablet (25 mg total) by mouth every 6 (six) hours as needed for dizziness. (Patient not taking: Reported on 05/01/2018) 20 tablet 0  . metoCLOPramide (REGLAN) 10 MG tablet Take 1 tablet (10 mg total) by mouth every 6 (six) hours as needed (nausea). (Patient not taking: Reported on 05/01/2018) 6 tablet 0  . OLANZapine (ZYPREXA) 15 MG tablet Take 2 tablets (30 mg total) by mouth at bedtime. 180 tablet 0   No current facility-administered medications for this visit.      Musculoskeletal: Strength & Muscle Tone: within normal limits Gait & Station: normal except slow gate due to his decreased vision Patient leans: N/A  Psychiatric Specialty Exam: Review of Systems  Psychiatric/Behavioral: Negative for depression, hallucinations, memory loss, substance abuse and suicidal ideas. The patient is not nervous/anxious and does not have insomnia.   All other systems reviewed and are negative.   Blood pressure 135/80, pulse (!) 58, height 5\' 5"  (1.651 m), weight 148 lb (67.1 kg).Body mass index is 24.63 kg/m.  General Appearance: Fairly Groomed  Eye Contact:  Minimal (decreased vision)  Speech:  Clear and Coherent  Volume:  Normal  Mood:  "good"  Affect:  Appropriate, Congruent and euthymic  Thought Process:  Coherent  Orientation:  Full (Time, Place, and Person)  Thought Content: Logical   Suicidal Thoughts:  No  Homicidal Thoughts:  No  Memory:  Immediate;   Good  Judgement:  Good  Insight:  Fair  Psychomotor  Activity:  Normal  Concentration:  Concentration: Good and Attention Span: Good  Recall:  Good  Fund of Knowledge: Good  Language: Good  Akathisia:  No  Handed:  Right  AIMS (if indicated): no tremors, no rigidity  Assets:  Communication Skills Desire for Improvement  ADL's:  Intact  Cognition: WNL  Sleep:  Good   Screenings:   Assessment and Plan:  Nicholas Moyer is a  63 y.o. year old male with a history of schizophrenia, legally blind, who presents for follow up appointment for Schizophrenia, unspecified type [facility])  # Schizophrenia Patient continues to demonstrate linear thought process, and denies significant psychotic symptoms, which has been consistent since the initial visit.  Collateral from the staff reports no concern of his behavior.  Will continue to slowly taper down olanzapine to target schizophrenia to avoid potential side effect.  Discussed potential metabolic side effect.  Will continue clonazepam as needed for anxiety.  Discussed risk of drowsiness and oversedation.   Plan I have reviewed and updated plans as below 1. Decrease olanzapine  30 mg at night (take two tabs of 15 mg) 2. Continue lorazepam 1 mg twice a day  3. Continue Trazodone 25-50 mg at night as needed for sleep- patient has not taken this 4. Return to clinic in three months for 15 mins Labs in July- to be scanned (lipid, glucose)  The patient demonstrates the following risk factors for suicide: Chronic risk factors for suicide include:psychiatric disorder ofschizophrenia. Acute risk factorsfor suicide include: unemployment. Protective factorsfor this patient include: positive social support. Considering these factors, the overall suicide risk at this point appears to below. Patientisappropriate for outpatient follow up.  Norman Clay, MD 05/01/2018, 4:17 PM

## 2018-05-01 ENCOUNTER — Encounter (HOSPITAL_COMMUNITY): Payer: Self-pay | Admitting: Psychiatry

## 2018-05-01 ENCOUNTER — Ambulatory Visit (INDEPENDENT_AMBULATORY_CARE_PROVIDER_SITE_OTHER): Payer: Medicare Other | Admitting: Psychiatry

## 2018-05-01 ENCOUNTER — Other Ambulatory Visit: Payer: Self-pay

## 2018-05-01 VITALS — BP 135/80 | HR 58 | Ht 65.0 in | Wt 148.0 lb

## 2018-05-01 DIAGNOSIS — Z79899 Other long term (current) drug therapy: Secondary | ICD-10-CM

## 2018-05-01 DIAGNOSIS — F209 Schizophrenia, unspecified: Secondary | ICD-10-CM

## 2018-05-01 MED ORDER — TRAZODONE HCL 50 MG PO TABS: ORAL_TABLET | ORAL | 0 refills | Status: DC

## 2018-05-01 MED ORDER — OLANZAPINE 15 MG PO TABS: 30.0000 mg | ORAL_TABLET | Freq: Every day | ORAL | 0 refills | Status: DC

## 2018-05-01 MED ORDER — LORAZEPAM 1 MG PO TABS: 1.0000 mg | ORAL_TABLET | Freq: Two times a day (BID) | ORAL | 2 refills | Status: DC

## 2018-05-01 NOTE — Patient Instructions (Signed)
1. Decrease olanzapine  30 mg at night (take two tabs of 15 mg) 2. Continue lorazepam 1 mg twice a day  3. Continue Trazodone 25-50 mg at night as needed for sleep 4. Return to clinic in three months for 15 mins

## 2018-05-12 ENCOUNTER — Telehealth (HOSPITAL_COMMUNITY): Payer: Self-pay | Admitting: *Deleted

## 2018-05-12 NOTE — Telephone Encounter (Signed)
[employer] Approved Olanzapine Tablet 15 mg Due to Quantity Limits: Authorizes to receive up to 60 every 30 days  Effective: 02-19-2018 ---- 05/12/2019

## 2018-06-30 ENCOUNTER — Other Ambulatory Visit (HOSPITAL_COMMUNITY): Payer: Self-pay | Admitting: Psychiatry

## 2018-06-30 MED ORDER — LORAZEPAM 1 MG PO TABS: 1.0000 mg | ORAL_TABLET | Freq: Two times a day (BID) | ORAL | 0 refills | Status: DC

## 2018-07-28 NOTE — Progress Notes (Signed)
Virtual Visit via Telephone Note  I connected with Nicholas Moyer on 08/04/18 at 10:00 AM EDT by telephone and verified that I am speaking with the correct person using two identifiers.   I discussed the limitations, risks, security and privacy concerns of performing an evaluation and management service by telephone and the availability of in person appointments. I also discussed with the patient that there may be a patient responsible charge related to this service. The patient expressed understanding and agreed to proceed.      I discussed the assessment and treatment plan with the patient. The patient was provided an opportunity to ask questions and all were answered. The patient agreed with the plan and demonstrated an understanding of the instructions.   The patient was advised to call back or seek an in-person evaluation if the symptoms worsen or if the condition fails to improve as anticipated.  I provided 15 minutes of non-face-to-face time during this encounter.   Norman Clay, MD    [facility] MD/PA/NP OP Progress Note  08/04/2018 10:29 AM Nicholas Moyer  MRN:  [id number]  Chief Complaint:  Chief Complaint    Follow-up; Schizophrenia     HPI:  This is a follow-up appointment for schizophrenia.  He states that he has been doing well.  He is working on IT consultant.  When he is asked that his provocative comment, he states that he will tell about woman when people ask him for story.  Although he occasionally feels that somebody is watching him, he tries to redirect as there is nothing he can do.  He feels safe at the group home.  He has fair sleep.  He denies feeling depressed.  He denies SI, HI, AH, VH.  He occasionally feels anxious.  He denies panic.   Ms. Lelon Frohlich presents to the room.  His roommate complains of  Nicholas Moyer's provocative comment. He may talk about woman's sexual anatomy, and what are men and women doing together. He is loud even at the public table, although he has been  doing well otherwise.  He seems a little restless. He sleeps from 9pm-3 am. No aggression or safety concern. His appetite is good. He takes medication regularly.  He is and does not think pandemic affects his daily routine as he rarely goes outside (he may go out with Ms. Ann for fast food at times)  Visit Diagnosis: No diagnosis found.  Past Psychiatric History: Please see initial evaluation for full details. I have reviewed the history. No updates at this time.     Past Medical History:  Past Medical History:  Diagnosis Date  . Constipation   . Schizophrenia Nicholas Moyer)     Past Surgical History:  Procedure Laterality Date  . APPENDECTOMY    . COLONOSCOPY    . COLONOSCOPY WITH PROPOFOL N/A 08/05/2017   Procedure: COLONOSCOPY WITH PROPOFOL;  Surgeon: Lin Landsman, MD;  Location: [facility] ENDOSCOPY;  Service: Gastroenterology;  Laterality: N/A;    Family Psychiatric History: Please see initial evaluation for full details. I have reviewed the history. No updates at this time.     Family History:  Family History  Problem Relation Age of Onset  . Alcohol abuse Maternal Aunt     Social History:  Social History   Socioeconomic History  . Marital status: Single    Spouse name: Not on file  . Number of children: Not on file  . Years of education: Not on file  . Highest education level: Not on file  Occupational History  . Not on file  Social Needs  . Financial resource strain: Not on file  . Food insecurity    Worry: Not on file    Inability: Not on file  . Transportation needs    Medical: Not on file    Non-medical: Not on file  Tobacco Use  . Smoking status: Never Smoker  . Smokeless tobacco: Never Used  Substance and Sexual Activity  . Alcohol use: Not Currently  . Drug use: Not Currently  . Sexual activity: Never  Lifestyle  . Physical activity    Days per week: Not on file    Minutes per session: Not on file  . Stress: Not on file  Relationships  . Social  Herbalist on phone: Not on file    Gets together: Not on file    Attends religious service: Not on file    Active member of club or organization: Not on file    Attends meetings of clubs or organizations: Not on file    Relationship status: Not on file  Other Topics Concern  . Not on file  Social History Narrative  . Not on file    Allergies:  Allergies  Allergen Reactions  . Prolixin [Fluphenazine] Other (See Comments)    Pt states his muscles lock up  . Haloperidol Lactate Other (See Comments)    Muscles lock up.  . Thioridazine Hcl Other (See Comments)    Induces heart attack.    Metabolic Disorder Labs: No results found for: HGBA1C, MPG No results found for: PROLACTIN No results found for: CHOL, TRIG, HDL, CHOLHDL, VLDL, LDLCALC No results found for: TSH  Therapeutic Level Labs: No results found for: LITHIUM No results found for: VALPROATE No components found for:  CBMZ  Current Medications: Current Outpatient Medications  Medication Sig Dispense Refill  . docusate sodium (COLACE) 100 MG capsule Take 100 mg by mouth 2 (two) times daily.    Marland Kitchen loratadine (CLARITIN) 10 MG tablet Take 10 mg by mouth daily.     Marland Kitchen LORazepam (ATIVAN) 1 MG tablet Take 1 tablet (1 mg total) by mouth 2 (two) times daily. 60 tablet 1  . meclizine (ANTIVERT) 25 MG tablet Take 1 tablet (25 mg total) by mouth every 6 (six) hours as needed for dizziness. (Patient not taking: Reported on 05/01/2018) 20 tablet 0  . metoCLOPramide (REGLAN) 10 MG tablet Take 1 tablet (10 mg total) by mouth every 6 (six) hours as needed (nausea). (Patient not taking: Reported on 05/01/2018) 6 tablet 0  . OLANZapine (ZYPREXA) 15 MG tablet 35 mg (20 mg+ 15 mg) at night 90 tablet 0  . OLANZapine (ZYPREXA) 20 MG tablet 35 mg (20 mg + 15 mg)  at night 90 tablet 0  . polyethylene glycol (MIRALAX / GLYCOLAX) packet Take 17 g by mouth daily.    . traZODone (DESYREL) 50 MG tablet Take 50 mg by mouth at bedtime as needed  for sleep.     No current facility-administered medications for this visit.      Musculoskeletal: Strength & Muscle Tone: N/A Gait & Station: N/A Patient leans: N/A  Psychiatric Specialty Exam: Review of Systems  Psychiatric/Behavioral: Negative for depression, hallucinations, memory loss, substance abuse and suicidal ideas. The patient is nervous/anxious. The patient does not have insomnia.   All other systems reviewed and are negative.   There were no vitals taken for this visit.There is no height or weight on file to calculate BMI.  General Appearance: NA  Eye Contact:  NA  Speech:  Clear and Coherent  Volume:  Normal  Mood:  "fine"  Affect:  NA  Thought Process:  Coherent  Orientation:  Full (Time, Place, and Person)  Thought Content: Paranoid Ideation   Suicidal Thoughts:  No  Homicidal Thoughts:  No  Memory:  Immediate;   Good  Judgement:  Fair  Insight:  Shallow  Psychomotor Activity:  Normal  Concentration:  Concentration: Good and Attention Span: Good  Recall:  Good  Fund of Knowledge: Good  Language: Good  Akathisia:  No  Handed:  Right  AIMS (if indicated): not done  Assets:  Communication Skills Desire for Improvement  ADL's:  Intact  Cognition: WNL  Sleep:  Fair   Screenings:   Assessment and Plan:  Clete Kuch is a 63 y.o. year old male with a history of schizophrenia, legally blind, who presents for follow up appointment for schizophrenia.   # Schizophrenia There has been a gradual worsening in provocative comment according to the collateral, and he reports some paranoia on today's evaluation since tapering down olanzapine.  Will uptitrate olanzapine back to original dose to target schizophrenia.  Discussed potential metabolic side effect and drowsiness.  Will continue clonazepam as needed for anxiety.  Discussed behavioral activation.   Plan I have reviewed and updated plans as below 1.Increase olanzapine  35 mg at night (worsening in  provocative comment at higher dose) 2.Continue lorazepam 1 mg twice a day 3. Continue Trazodone 25-50 mg at night as needed for sleep- patient has not taken this 4. Next appointment: 8/11 at 9 AM for 20 mins, phone  Labs in July- to be scanned (lipid, glucose) Fax: [phone]  The patient demonstrates the following risk factors for suicide: Chronic risk factors for suicide include:psychiatric disorder ofschizophrenia. Acute risk factorsfor suicide include: unemployment. Protective factorsfor this patient include: positive social support. Considering these factors, the overall suicide risk at this point appears to below. Patientisappropriate for outpatient follow up.   Norman Clay, MD 08/04/2018, 10:29 AM

## 2018-08-04 ENCOUNTER — Encounter (HOSPITAL_COMMUNITY): Payer: Self-pay | Admitting: Psychiatry

## 2018-08-04 ENCOUNTER — Ambulatory Visit (INDEPENDENT_AMBULATORY_CARE_PROVIDER_SITE_OTHER): Payer: Medicare Other | Admitting: Psychiatry

## 2018-08-04 ENCOUNTER — Other Ambulatory Visit: Payer: Self-pay

## 2018-08-04 DIAGNOSIS — F209 Schizophrenia, unspecified: Secondary | ICD-10-CM

## 2018-08-04 MED ORDER — LORAZEPAM 1 MG PO TABS: 1.0000 mg | ORAL_TABLET | Freq: Two times a day (BID) | ORAL | 1 refills | Status: DC

## 2018-08-04 MED ORDER — OLANZAPINE 15 MG PO TABS: ORAL_TABLET | ORAL | 0 refills | Status: DC

## 2018-08-04 MED ORDER — OLANZAPINE 20 MG PO TABS: ORAL_TABLET | ORAL | 0 refills | Status: DC

## 2018-08-04 NOTE — Patient Instructions (Addendum)
1.Increase olanzapine35 mg at night  2.Continue lorazepam 1 mg twice a day 3. Continue Trazodone 25-50 mg at night as needed for sleep- patient has not taken this 4. Next appointment: 8/11 at 9 AM

## 2018-09-25 NOTE — Progress Notes (Signed)
Virtual Visit via Telephone Note  I connected with Nicholas Moyer on 09/30/18 at  9:00 AM EDT by telephone and verified that I am speaking with the correct person using two identifiers.   I discussed the limitations, risks, security and privacy concerns of performing an evaluation and management service by telephone and the availability of in person appointments. I also discussed with the patient that there may be a patient responsible charge related to this service. The patient expressed understanding and agreed to proceed.       I discussed the assessment and treatment plan with the patient. The patient was provided an opportunity to ask questions and all were answered. The patient agreed with the plan and demonstrated an understanding of the instructions.   The patient was advised to call back or seek an in-person evaluation if the symptoms worsen or if the condition fails to improve as anticipated.  I provided 15 minutes of non-face-to-face time during this encounter.   Norman Clay, MD    [facility] MD/PA/NP OP Progress Note  09/30/2018 9:22 AM Nicholas Moyer  MRN:  [id number]  Chief Complaint:  Chief Complaint    Schizophrenia; Follow-up     HPI:  This is a follow-up appointment for schizophrenia.  He states that he has been doing fine.  He ate boiled egg and spam this morning. He has been working on IT consultant.  Although he does have a feeling that somebody might try to get him and he will be unable to escape, he feels comfortable at the current place. He denies feeling depressed or anxiety.  He denies SI, HI, AH, VH.  He denies any years of reference.   The staff states that he has been doing well.  He has not had any provocative comment or any behavioral concerns.  He takes medication regularly.  He has good appetite.  He sleeps well at night.  There is no concern from the nurse.    Wt Readings from Last 3 Encounters:  05/01/18 148 lb (67.1 kg)  01/07/18 142 lb (64.4 kg)   10/07/17 137 lb (62.1 kg)    Visit Diagnosis:    ICD-10-CM   1. Schizophrenia, unspecified type ([place])  F20.9     Past Psychiatric History: Please see initial evaluation for full details. I have reviewed the history. No updates at this time.     Past Medical History:  Past Medical History:  Diagnosis Date  . Constipation   . Schizophrenia [facility])     Past Surgical History:  Procedure Laterality Date  . APPENDECTOMY    . COLONOSCOPY    . COLONOSCOPY WITH PROPOFOL N/A 08/05/2017   Procedure: COLONOSCOPY WITH PROPOFOL;  Surgeon: Lin Landsman, MD;  Location: [facility] ENDOSCOPY;  Service: Gastroenterology;  Laterality: N/A;    Family Psychiatric History: Please see initial evaluation for full details. I have reviewed the history. No updates at this time.     Family History:  Family History  Problem Relation Age of Onset  . Alcohol abuse Maternal Aunt     Social History:  Social History   Socioeconomic History  . Marital status: Single    Spouse name: Not on file  . Number of children: Not on file  . Years of education: Not on file  . Highest education level: Not on file  Occupational History  . Not on file  Social Needs  . Financial resource strain: Not on file  . Food insecurity    Worry: Not on file  Inability: Not on file  . Transportation needs    Medical: Not on file    Non-medical: Not on file  Tobacco Use  . Smoking status: Never Smoker  . Smokeless tobacco: Never Used  Substance and Sexual Activity  . Alcohol use: Not Currently  . Drug use: Not Currently  . Sexual activity: Never  Lifestyle  . Physical activity    Days per week: Not on file    Minutes per session: Not on file  . Stress: Not on file  Relationships  . Social Herbalist on phone: Not on file    Gets together: Not on file    Attends religious service: Not on file    Active member of club or organization: Not on file    Attends meetings of clubs or organizations: Not  on file    Relationship status: Not on file  Other Topics Concern  . Not on file  Social History Narrative  . Not on file    Allergies:  Allergies  Allergen Reactions  . Prolixin [Fluphenazine] Other (See Comments)    Pt states his muscles lock up  . Haloperidol Lactate Other (See Comments)    Muscles lock up.  . Thioridazine Hcl Other (See Comments)    Induces heart attack.    Metabolic Disorder Labs: No results found for: HGBA1C, MPG No results found for: PROLACTIN No results found for: CHOL, TRIG, HDL, CHOLHDL, VLDL, LDLCALC No results found for: TSH  Therapeutic Level Labs: No results found for: LITHIUM No results found for: VALPROATE No components found for:  CBMZ  Current Medications: Current Outpatient Medications  Medication Sig Dispense Refill  . docusate sodium (COLACE) 100 MG capsule Take 100 mg by mouth 2 (two) times daily.    Marland Kitchen loratadine (CLARITIN) 10 MG tablet Take 10 mg by mouth daily.     Derrill Memo ON 10/16/2018] LORazepam (ATIVAN) 1 MG tablet Take 1 tablet (1 mg total) by mouth 2 (two) times daily. 60 tablet 2  . [START ON 11/04/2018] OLANZapine (ZYPREXA) 15 MG tablet 35 mg (20 mg+ 15 mg) at night 90 tablet 0  . [START ON 11/04/2018] OLANZapine (ZYPREXA) 20 MG tablet 35 mg (20 mg + 15 mg)  at night 90 tablet 0  . polyethylene glycol (MIRALAX / GLYCOLAX) packet Take 17 g by mouth daily.    . traZODone (DESYREL) 50 MG tablet Take 50 mg by mouth at bedtime as needed for sleep.    . meclizine (ANTIVERT) 25 MG tablet Take 1 tablet (25 mg total) by mouth every 6 (six) hours as needed for dizziness. (Patient not taking: Reported on 05/01/2018) 20 tablet 0  . metoCLOPramide (REGLAN) 10 MG tablet Take 1 tablet (10 mg total) by mouth every 6 (six) hours as needed (nausea). (Patient not taking: Reported on 05/01/2018) 6 tablet 0   No current facility-administered medications for this visit.      Musculoskeletal: Strength & Muscle Tone: N/A Gait & Station:  N/A Patient leans: N/A  Psychiatric Specialty Exam: Review of Systems  Psychiatric/Behavioral: Negative for depression, hallucinations, memory loss, substance abuse and suicidal ideas. The patient is not nervous/anxious and does not have insomnia.   All other systems reviewed and are negative.   There were no vitals taken for this visit.There is no height or weight on file to calculate BMI.  General Appearance: NA  Eye Contact:  NA  Speech:  Clear and Coherent  Volume:  Normal  Mood:  "good"  Affect:  NA  Thought Process:  Coherent  Orientation:  Full (Time, Place, and Person)  Thought Content: Logical   Suicidal Thoughts:  No  Homicidal Thoughts:  No  Memory:  Immediate;   Good  Judgement:  Fair  Insight:  Present  Psychomotor Activity:  Normal  Concentration:  Concentration: Good and Attention Span: Good  Recall:  Good  Fund of Knowledge: Good  Language: Good  Akathisia:  No  Handed:  Right  AIMS (if indicated): not done  Assets:  Communication Skills Desire for Improvement  ADL's:  Intact  Cognition: WNL  Sleep:  Good   Screenings:   Assessment and Plan:  Nicholas Moyer is a 63 y.o. year old male with a history of schizophrenia, legally blind, who presents for follow up appointment for schizophrenia.   # Schizophrenia There has been no significant behavior issues since up titration of olanzapine, although he does have paranoia at his baseline.  Will continue olanzapine to target schizophrenia.  Noted that although it is higher than recommended dose, will continue the current dose given his worsening in symptoms at lower dose.  Discussed potential metabolic side effect and drowsiness.  Will continue clonazepam as needed for anxiety.  Will continue trazodone as needed for insomnia.  The staff is reminded to fax the lab results and weight for monitoring.   Plan I have reviewed and updated plans as below 1. Continue olanzapine35 mg at night (worsening in provocative  comment at lower dose) 2.Continue lorazepam 1 mg twice a day  3. Continue Trazodone 25-50 mg at night as needed for sleep- patient has not taken this 4. Next appointment: 11/3 9:20 for 20 mins, phone Labs in July- staff to fax the result (lipid, glucose) and the most recent weight Fax: [phone]  The patient demonstrates the following risk factors for suicide: Chronic risk factors for suicide include:psychiatric disorder ofschizophrenia. Acute risk factorsfor suicide include: unemployment. Protective factorsfor this patient include: positive social support. Considering these factors, the overall suicide risk at this point appears to below. Patientisappropriate for outpatient follow up.  Norman Clay, MD 09/30/2018, 9:22 AM

## 2018-09-30 ENCOUNTER — Ambulatory Visit (INDEPENDENT_AMBULATORY_CARE_PROVIDER_SITE_OTHER): Payer: Medicare Other | Admitting: Psychiatry

## 2018-09-30 ENCOUNTER — Other Ambulatory Visit: Payer: Self-pay

## 2018-09-30 ENCOUNTER — Encounter (HOSPITAL_COMMUNITY): Payer: Self-pay | Admitting: Psychiatry

## 2018-09-30 DIAGNOSIS — F209 Schizophrenia, unspecified: Secondary | ICD-10-CM

## 2018-09-30 MED ORDER — OLANZAPINE 20 MG PO TABS: ORAL_TABLET | ORAL | 0 refills | Status: DC

## 2018-09-30 MED ORDER — OLANZAPINE 15 MG PO TABS: ORAL_TABLET | ORAL | 0 refills | Status: DC

## 2018-09-30 MED ORDER — LORAZEPAM 1 MG PO TABS: 1.0000 mg | ORAL_TABLET | Freq: Two times a day (BID) | ORAL | 2 refills | Status: DC

## 2018-09-30 NOTE — Patient Instructions (Signed)
1. Continue olanzapine35 mg at night  2.Continue lorazepam 1 mg twice a day  3. Continue Trazodone 25-50 mg at night as needed for sleep 4. Next appointment: 11/3 9:20 5. Please fax [place] the lab result (lipid, glucose), and his weight

## 2018-12-16 NOTE — Progress Notes (Signed)
Virtual Visit via Telephone Note  I connected with Nicholas Moyer on 12/23/18 at  9:20 AM EST by telephone and verified that I am speaking with the correct person using two identifiers.   I discussed the limitations, risks, security and privacy concerns of performing an evaluation and management service by telephone and the availability of in person appointments. I also discussed with the patient that there may be a patient responsible charge related to this service. The patient expressed understanding and agreed to proceed.      I discussed the assessment and treatment plan with the patient. The patient was provided an opportunity to ask questions and all were answered. The patient agreed with the plan and demonstrated an understanding of the instructions.   The patient was advised to call back or seek an in-person evaluation if the symptoms worsen or if the condition fails to improve as anticipated.  I provided 15 minutes of non-face-to-face time during this encounter.   Nicholas Clay, MD    [facility] MD/PA/NP OP Progress Note  12/23/2018 9:57 AM Nicholas Moyer  MRN:  [id number]  Chief Complaint:  Chief Complaint    Schizophrenia; Follow-up     HPI:  This is a follow-up appointment for schizophrenia.  He states that "Everything is going smooth." He reports good relationship with his roommate. When he is inquired of his episode of refusing to eat, he states that he stopped eating as he did not like "green." Although he feels that somebody is trying to get him, "there is nothing I can do about it," and feels comfortable staying at group home (although he initially mentions that he hopes to go back home in [facility].) He denies insomnia.  He denies feeling depressed or anxiety.  He denies SI, HI, AH, VH.  He denies ideas of reference.   Ms. Elza Rafter, care manager at group home presents to the interview.  Nicholas Moyer refused to eat meal and take medication as it is "poisoned" by staff. It lasted  for a week, although he has started to eat and take medication this week. Jasan continues to make provacative comments about woman's anatomy, while he has been doing it for many years Nicholas Moyer takes care of him since 2008). He also ruminates on constipation. No behavioral or safety concern otherwise. Nicholas Moyer thinks that part of the reason he has been acting this way as his roommate is "spiritual," staying in the room most of the time due to pandemic.   Visit Diagnosis:    ICD-10-CM   1. Schizophrenia, unspecified type ([place])  F20.9     Past Psychiatric History: Please see initial evaluation for full details. I have reviewed the history. No updates at this time.     Past Medical History:  Past Medical History:  Diagnosis Date  . Constipation   . Schizophrenia [facility])     Past Surgical History:  Procedure Laterality Date  . APPENDECTOMY    . COLONOSCOPY    . COLONOSCOPY WITH PROPOFOL N/A 08/05/2017   Procedure: COLONOSCOPY WITH PROPOFOL;  Surgeon: Lin Landsman, MD;  Location: [facility] ENDOSCOPY;  Service: Gastroenterology;  Laterality: N/A;    Family Psychiatric History: Please see initial evaluation for full details. I have reviewed the history. No updates at this time.     Family History:  Family History  Problem Relation Age of Onset  . Alcohol abuse Maternal Aunt     Social History:  Social History   Socioeconomic History  . Marital status: Single  Spouse name: Not on file  . Number of children: Not on file  . Years of education: Not on file  . Highest education level: Not on file  Occupational History  . Not on file  Social Needs  . Financial resource strain: Not on file  . Food insecurity    Worry: Not on file    Inability: Not on file  . Transportation needs    Medical: Not on file    Non-medical: Not on file  Tobacco Use  . Smoking status: Never Smoker  . Smokeless tobacco: Never Used  Substance and Sexual Activity  . Alcohol use: Not Currently  . Drug use:  Not Currently  . Sexual activity: Never  Lifestyle  . Physical activity    Days per week: Not on file    Minutes per session: Not on file  . Stress: Not on file  Relationships  . Social Herbalist on phone: Not on file    Gets together: Not on file    Attends religious service: Not on file    Active member of club or organization: Not on file    Attends meetings of clubs or organizations: Not on file    Relationship status: Not on file  Other Topics Concern  . Not on file  Social History Narrative  . Not on file    Allergies:  Allergies  Allergen Reactions  . Prolixin [Fluphenazine] Other (See Comments)    Pt states his muscles lock up  . Haloperidol Lactate Other (See Comments)    Muscles lock up.  . Thioridazine Hcl Other (See Comments)    Induces heart attack.    Metabolic Disorder Labs: No results found for: HGBA1C, MPG No results found for: PROLACTIN No results found for: CHOL, TRIG, HDL, CHOLHDL, VLDL, LDLCALC No results found for: TSH  Therapeutic Level Labs: No results found for: LITHIUM No results found for: VALPROATE No components found for:  CBMZ  Current Medications: Current Outpatient Medications  Medication Sig Dispense Refill  . docusate sodium (COLACE) 100 MG capsule Take 100 mg by mouth 2 (two) times daily.    Marland Kitchen loratadine (CLARITIN) 10 MG tablet Take 10 mg by mouth daily.     Marland Kitchen LORazepam (ATIVAN) 1 MG tablet Take 1 tablet (1 mg total) by mouth 2 (two) times daily. 60 tablet 3  . meclizine (ANTIVERT) 25 MG tablet Take 1 tablet (25 mg total) by mouth every 6 (six) hours as needed for dizziness. (Patient not taking: Reported on 05/01/2018) 20 tablet 0  . metoCLOPramide (REGLAN) 10 MG tablet Take 1 tablet (10 mg total) by mouth every 6 (six) hours as needed (nausea). (Patient not taking: Reported on 05/01/2018) 6 tablet 0  . [START ON 02/03/2019] OLANZapine (ZYPREXA) 15 MG tablet 35 mg (20 mg+ 15 mg) at night 90 tablet 1  . [START ON  02/02/2019] OLANZapine (ZYPREXA) 20 MG tablet 35 mg (20 mg + 15 mg)  at night 90 tablet 1  . polyethylene glycol (MIRALAX / GLYCOLAX) packet Take 17 g by mouth daily.    . traZODone (DESYREL) 50 MG tablet Take 50 mg by mouth at bedtime as needed for sleep.     No current facility-administered medications for this visit.      Musculoskeletal: Strength & Muscle Tone: N/A Gait & Station: N/A Patient leans: N/A  Psychiatric Specialty Exam: Review of Systems  Psychiatric/Behavioral: Negative for depression, hallucinations, memory loss, substance abuse and suicidal ideas. The patient is not  nervous/anxious and does not have insomnia.   All other systems reviewed and are negative.   There were no vitals taken for this visit.There is no height or weight on file to calculate BMI.  General Appearance: NA  Eye Contact:  NA  Speech:  Clear and Coherent  Volume:  Normal  Mood:  "good"  Affect:  NA  Thought Process:  Coherent  Orientation:  Full (Time, Place, and Person)  Thought Content: Paranoid Ideation   Suicidal Thoughts:  No  Homicidal Thoughts:  No  Memory:  Immediate;   Good  Judgement:  Good  Insight:  Shallow  Psychomotor Activity:  Normal  Concentration:  Concentration: Good and Attention Span: Good  Recall:  Good  Fund of Knowledge: Good  Language: Good  Akathisia:  No  Handed:  Right  AIMS (if indicated): not done  Assets:  Communication Skills Desire for Improvement  ADL's:  Intact  Cognition: WNL  Sleep:  Good   Screenings:   Assessment and Plan:  Klint Kaaihue is a 63 y.o. year old male with a history of schizophrenia, legally blind, who presents for follow up appointment for Schizophrenia, unspecified type ([place])  # Schizophrenia Although he continues to have occasional provocative comment, and there was an episode of delusions last week, it has been improving this week according to the staff.  Will continue olanzapine to target schizophrenia.  Noted that  although it is higher than recommended dose, he has a history of worsening in his symptoms at lower dose.  It is considered the benefit of treating with this dose outweighs risk.  Discussed potential metabolic side effect and drowsiness.  We will continue lorazepam prn for anxiety.  We will continue trazodone as needed for insomnia.  The staff is asked again to fax the recent blood test/weight for monitoring.   Plan I have reviewed and updated plans as below 1. Continue olanzapine35mg  at night (worsening in provocative comment at lower dose) 2.Continue lorazepam 1 mg twice a day  3. Continue Trazodone 25-50 mg at night as needed for sleep- patient has not taken this 4.Next appointment: January Asked again to fax the blood test result (lipid, glucose) and his weight Fax: [phone]  The patient demonstrates the following risk factors for suicide: Chronic risk factors for suicide include:psychiatric disorder ofschizophrenia. Acute risk factorsfor suicide include: unemployment. Protective factorsfor this patient include: positive social support. Considering these factors, the overall suicide risk at this point appears to below. Patientisappropriate for outpatient follow up.  Nicholas Clay, MD 12/23/2018, 9:57 AM

## 2018-12-23 ENCOUNTER — Other Ambulatory Visit: Payer: Self-pay

## 2018-12-23 ENCOUNTER — Encounter (HOSPITAL_COMMUNITY): Payer: Self-pay | Admitting: Psychiatry

## 2018-12-23 ENCOUNTER — Ambulatory Visit (INDEPENDENT_AMBULATORY_CARE_PROVIDER_SITE_OTHER): Payer: Medicare Other | Admitting: Psychiatry

## 2018-12-23 DIAGNOSIS — F209 Schizophrenia, unspecified: Secondary | ICD-10-CM | POA: Diagnosis not present

## 2018-12-23 MED ORDER — OLANZAPINE 15 MG PO TABS: ORAL_TABLET | ORAL | 1 refills | Status: DC

## 2018-12-23 MED ORDER — LORAZEPAM 1 MG PO TABS: 1.0000 mg | ORAL_TABLET | Freq: Two times a day (BID) | ORAL | 3 refills | Status: DC

## 2018-12-23 MED ORDER — OLANZAPINE 20 MG PO TABS: ORAL_TABLET | ORAL | 1 refills | Status: DC

## 2018-12-23 NOTE — Patient Instructions (Addendum)
1. Continue olanzapine35mg  at night  2.Continue lorazepam 1 mg twice a day  3. Continue Trazodone 25-50 mg at night as needed for sleep 4.Next appointment: January 5. Please fax [place] the recent blood test (for lipid panels, glucose) and his weight

## 2019-03-09 ENCOUNTER — Ambulatory Visit (HOSPITAL_COMMUNITY): Payer: Medicare Other | Admitting: Psychiatry

## 2019-03-25 ENCOUNTER — Encounter: Payer: Self-pay | Admitting: Psychiatry

## 2019-03-25 ENCOUNTER — Ambulatory Visit (INDEPENDENT_AMBULATORY_CARE_PROVIDER_SITE_OTHER): Payer: Medicare Other | Admitting: Psychiatry

## 2019-03-25 ENCOUNTER — Other Ambulatory Visit: Payer: Self-pay

## 2019-03-25 DIAGNOSIS — F209 Schizophrenia, unspecified: Secondary | ICD-10-CM

## 2019-03-25 MED ORDER — LORAZEPAM 1 MG PO TABS: 1.0000 mg | ORAL_TABLET | Freq: Two times a day (BID) | ORAL | 2 refills | Status: DC

## 2019-03-25 MED ORDER — OLANZAPINE 15 MG PO TABS: ORAL_TABLET | ORAL | 1 refills | Status: DC

## 2019-03-25 MED ORDER — OLANZAPINE 20 MG PO TABS: ORAL_TABLET | ORAL | 1 refills | Status: DC

## 2019-03-25 NOTE — Progress Notes (Signed)
[place] MD OP Progress Note  Virtual Visit via Telephone Note  I connected with Nicholas Moyer on 03/25/19 at 10:30 AM EST by telephone and verified that I am speaking with the correct person using two identifiers.    I discussed the limitations, risks, security and privacy concerns of performing an evaluation and management service by telephone and the availability of in person appointments. I also discussed with the patient that there may be a patient responsible charge related to this service. The patient expressed understanding and agreed to proceed.    03/25/2019 10:36 AM Nicholas Moyer  MRN:  [id number]  Chief Complaint:  " I am doing okay."  HPI: Pt resides in a residential facility. His caretaker Ms. Long informed that patient has been doing well.  She stated that the staff is no acute concerns for him. Patient reported that his medications are helpful with his hallucinations and he is doing well.  He informed that he has been sleeping well at night. When asked if he had any concerns to share, he stated that he is not planning to take the COVID-19 vaccination as it is making people very sick.  Patient was informed that people do get sick for maybe about 36 hours after the vaccination however the recover just fine after that.  Ms. Laverta Baltimore informed that everyone in the residential facility including herself has received both the COVID-19 vaccinations however Mr. Tenerelli has been refusing to do so.  She also informed that Mr. Nathen May never takes the flu vaccination either. They both denied any other complaints at this time.  Visit Diagnosis:    ICD-10-CM   1. Schizophrenia, unspecified type ([facility]icholas Moyer)  F20.9     Past Psychiatric History: Schizophrenia  Past Medical History:  Past Medical History:  Diagnosis Date  . Constipation   . Schizophrenia [facility])     Past Surgical History:  Procedure Laterality Date  . APPENDECTOMY    . COLONOSCOPY    . COLONOSCOPY WITH PROPOFOL N/A 08/05/2017   Procedure: COLONOSCOPY WITH PROPOFOL;  Surgeon: Lin Landsman, MD;  [facility];  Service: Gastroenterology;  Laterality: N/A;    Family Psychiatric History: See below  Family History:  Family History  Problem Relation Age of Onset  . Alcohol abuse Maternal Aunt     Social History:  Social History   Socioeconomic History  . Marital status: Single    Spouse name: Not on file  . Number of children: Not on file  . Years of education: Not on file  . Highest education level: Not on file  Occupational History  . Not on file  Tobacco Use  . Smoking status: Never Smoker  . Smokeless tobacco: Never Used  Substance and Sexual Activity  . Alcohol use: Not Currently  . Drug use: Not Currently  . Sexual activity: Never  Other Topics Concern  . Not on file  Social History Narrative  . Not on file   Social Determinants of Health   Financial Resource Strain:   . Difficulty of Paying Living Expenses: Not on file  Food Insecurity:   . Worried About Charity fundraiser in the Last Year: Not on file  . Ran Out of Food in the Last Year: Not on file  Transportation Needs:   . Lack of Transportation (Medical): Not on file  . Lack of Transportation (Non-Medical): Not on file  Physical Activity:   . Days of Exercise per Week: Not on file  . Minutes of Exercise per Session:  Not on file  Stress:   . Feeling of Stress : Not on file  Social Connections:   . Frequency of Communication with Friends and Family: Not on file  . Frequency of Social Gatherings with Friends and Family: Not on file  . Attends Religious Services: Not on file  . Active Member of Clubs or Organizations: Not on file  . Attends Archivist Meetings: Not on file  . Marital Status: Not on file    Allergies:  Allergies  Allergen Reactions  . Prolixin [Fluphenazine] Other (See Comments)    Pt states his muscles lock up  . Haloperidol Lactate Other (See Comments)    Muscles lock up.  .  Thioridazine Hcl Other (See Comments)    Induces heart attack.    Metabolic Disorder Labs: No results found for: HGBA1C, MPG No results found for: PROLACTIN No results found for: CHOL, TRIG, HDL, CHOLHDL, VLDL, LDLCALC No results found for: TSH  Therapeutic Level Labs: No results found for: LITHIUM No results found for: VALPROATE No components found for:  CBMZ  Current Medications: Current Outpatient Medications  Medication Sig Dispense Refill  . docusate sodium (COLACE) 100 MG capsule Take 100 mg by mouth 2 (two) times daily.    Marland Kitchen loratadine (CLARITIN) 10 MG tablet Take 10 mg by mouth daily.     Marland Kitchen LORazepam (ATIVAN) 1 MG tablet Take 1 tablet (1 mg total) by mouth 2 (two) times daily. 60 tablet 3  . meclizine (ANTIVERT) 25 MG tablet Take 1 tablet (25 mg total) by mouth every 6 (six) hours as needed for dizziness. (Patient not taking: Reported on 05/01/2018) 20 tablet 0  . metoCLOPramide (REGLAN) 10 MG tablet Take 1 tablet (10 mg total) by mouth every 6 (six) hours as needed (nausea). (Patient not taking: Reported on 05/01/2018) 6 tablet 0  . OLANZapine (ZYPREXA) 15 MG tablet 35 mg (20 mg+ 15 mg) at night 90 tablet 1  . OLANZapine (ZYPREXA) 20 MG tablet 35 mg (20 mg + 15 mg)  at night 90 tablet 1  . polyethylene glycol (MIRALAX / GLYCOLAX) packet Take 17 g by mouth daily.    . traZODone (DESYREL) 50 MG tablet Take 50 mg by mouth at bedtime as needed for sleep.     No current facility-administered medications for this visit.      Psychiatric Specialty Exam: Review of Systems  There were no vitals taken for this visit.There is no height or weight on file to calculate BMI.  General Appearance: unable to assess due to phone visit  Eye Contact:  unable to assess due to phone visit  Speech:  Normal Rate  Volume:  Increased  Mood:  Euthymic  Affect:  Congruent  Thought Process:  Goal Directed, Linear and Descriptions of Associations: Intact  Orientation:  Full (Time, Place, and  Person)  Thought Content: Logical and Rumination   Suicidal Thoughts:  No  Homicidal Thoughts:  No  Memory:  Immediate;   Good Recent;   Good  Judgement:  Fair  Insight:  Fair  Psychomotor Activity:  Normal  Concentration:  Concentration: Good and Attention Span: Good  Recall:  Good  Fund of Knowledge: Good  Language: Good  Akathisia:  denied  Handed:  Right  AIMS (if indicated): unable to conduct due to phone visit  Assets:  Communication Skills Desire for Improvement Financial Resources/Insurance Housing  ADL's:  Intact  Cognition: WNL  Sleep:  Good    Assessment and Plan: This is a 64 year old  male with history of schizophrenia who is legally blind residing in residential living facility now seen for follow-up via phone visit.  Patient and his caretaker Ms. Long informed that he is doing fine and denies any acute issues or concerns at this time.  1. Schizophrenia, unspecified type ([place])  - OLANZapine (ZYPREXA) 15 MG tablet; 35 mg (20 mg+ 15 mg) at night  Dispense: 90 tablet; Refill: 1 - OLANZapine (ZYPREXA) 20 MG tablet; 35 mg (20 mg + 15 mg)  at night  Dispense: 90 tablet; Refill: 1 - LORazepam (ATIVAN) 1 MG tablet; Take 1 tablet (1 mg total) by mouth 2 (two) times daily.  Dispense: 60 tablet; Refill: 2  Continue same medication regimen. Follow up in 3 months.    [place] Crane, MD 03/25/2019, 10:36 AM
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
## 2019-06-22 ENCOUNTER — Telehealth (INDEPENDENT_AMBULATORY_CARE_PROVIDER_SITE_OTHER): Payer: Medicare Other | Admitting: Psychiatry

## 2019-06-22 ENCOUNTER — Encounter (HOSPITAL_COMMUNITY): Payer: Self-pay | Admitting: Psychiatry

## 2019-06-22 ENCOUNTER — Other Ambulatory Visit: Payer: Self-pay

## 2019-06-22 DIAGNOSIS — F209 Schizophrenia, unspecified: Secondary | ICD-10-CM

## 2019-06-22 MED ORDER — LORAZEPAM 1 MG PO TABS: 1.0000 mg | ORAL_TABLET | Freq: Two times a day (BID) | ORAL | 3 refills | Status: DC

## 2019-06-22 NOTE — Patient Instructions (Signed)
1.Continueolanzapine35mg  at night 2.Continue lorazepam 1 mg twice a day   3. Please obtain labs (lipid panels, glucose) and measure weight with his primary care doctor 4.Next appointment:8/10 at 9:10
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
## 2019-09-29 ENCOUNTER — Other Ambulatory Visit: Payer: Self-pay

## 2019-09-29 ENCOUNTER — Encounter (HOSPITAL_COMMUNITY): Payer: Self-pay | Admitting: Psychiatry

## 2019-09-29 ENCOUNTER — Telehealth (INDEPENDENT_AMBULATORY_CARE_PROVIDER_SITE_OTHER): Payer: Medicare Other | Admitting: Psychiatry

## 2019-09-29 DIAGNOSIS — F209 Schizophrenia, unspecified: Secondary | ICD-10-CM | POA: Diagnosis not present

## 2019-09-29 DIAGNOSIS — F411 Generalized anxiety disorder: Secondary | ICD-10-CM

## 2019-09-29 MED ORDER — OLANZAPINE 20 MG PO TABS: ORAL_TABLET | ORAL | 0 refills | Status: DC

## 2019-09-29 MED ORDER — LORAZEPAM 0.5 MG PO TABS: 0.5000 mg | ORAL_TABLET | Freq: Two times a day (BID) | ORAL | 1 refills | Status: DC

## 2019-09-29 MED ORDER — OLANZAPINE 15 MG PO TABS: ORAL_TABLET | ORAL | 0 refills | Status: DC

## 2019-09-29 NOTE — Patient Instructions (Signed)
1.Continueolanzapine35mg  at night  2.Decrease lorazepam 0.5  mg twice a day   3. Please obtain labs (lipid panels, glucose) and measure weight with his primary care doctor 4.Next appointment: 10/26 at 8:50

## 2019-12-09 NOTE — Progress Notes (Deleted)
[place] MD/PA/NP OP Progress Note  12/09/2019 4:08 PM Nicholas Moyer  MRN:  [id number]  Chief Complaint:  HPI: *** Visit Diagnosis: No diagnosis found.  Past Psychiatric History: Please see initial evaluation for full details. I have reviewed the history. No updates at this time.     Past Medical History:  Past Medical History:  Diagnosis Date  . Constipation   . Schizophrenia [facility])     Past Surgical History:  Procedure Laterality Date  . APPENDECTOMY    . COLONOSCOPY    . COLONOSCOPY WITH PROPOFOL N/A 08/05/2017   Procedure: COLONOSCOPY WITH PROPOFOL;  Surgeon: Lin Landsman, MD;  Location: [facility] ENDOSCOPY;  Service: Gastroenterology;  Laterality: N/A;    Family Psychiatric History: Please see initial evaluation for full details. I have reviewed the history. No updates at this time.     Family History:  Family History  Problem Relation Age of Onset  . Alcohol abuse Maternal Aunt     Social History:  Social History   Socioeconomic History  . Marital status: Single    Spouse name: Not on file  . Number of children: Not on file  . Years of education: Not on file  . Highest education level: Not on file  Occupational History  . Not on file  Tobacco Use  . Smoking status: Never Smoker  . Smokeless tobacco: Never Used  Vaping Use  . Vaping Use: Never used  Substance and Sexual Activity  . Alcohol use: Not Currently  . Drug use: Not Currently  . Sexual activity: Never  Other Topics Concern  . Not on file  Social History Narrative  . Not on file   Social Determinants of Health   Financial Resource Strain:   . Difficulty of Paying Living Expenses: Not on file  Food Insecurity:   . Worried About Charity fundraiser in the Last Year: Not on file  . Ran Out of Food in the Last Year: Not on file  Transportation Needs:   . Lack of Transportation (Medical): Not on file  . Lack of Transportation (Non-Medical): Not on file  Physical Activity:   . Days of  Exercise per Week: Not on file  . Minutes of Exercise per Session: Not on file  Stress:   . Feeling of Stress : Not on file  Social Connections:   . Frequency of Communication with Friends and Family: Not on file  . Frequency of Social Gatherings with Friends and Family: Not on file  . Attends Religious Services: Not on file  . Active Member of Clubs or Organizations: Not on file  . Attends Archivist Meetings: Not on file  . Marital Status: Not on file    Allergies:  Allergies  Allergen Reactions  . Prolixin [Fluphenazine] Other (See Comments)    Pt states his muscles lock up  . Haloperidol Lactate Other (See Comments)    Muscles lock up.  . Thioridazine Hcl Other (See Comments)    Induces heart attack.    Metabolic Disorder Labs: No results found for: HGBA1C, MPG No results found for: PROLACTIN No results found for: CHOL, TRIG, HDL, CHOLHDL, VLDL, LDLCALC No results found for: TSH  Therapeutic Level Labs: No results found for: LITHIUM No results found for: VALPROATE No components found for:  CBMZ  Current Medications: Current Outpatient Medications  Medication Sig Dispense Refill  . docusate sodium (COLACE) 100 MG capsule Take 100 mg by mouth 2 (two) times daily.    Marland Kitchen loratadine (CLARITIN)  10 MG tablet Take 10 mg by mouth daily.     Marland Kitchen LORazepam (ATIVAN) 0.5 MG tablet Take 1 tablet (0.5 mg total) by mouth 2 (two) times daily. 60 tablet 1  . meclizine (ANTIVERT) 25 MG tablet Take 1 tablet (25 mg total) by mouth every 6 (six) hours as needed for dizziness. (Patient not taking: Reported on 05/01/2018) 20 tablet 0  . metoCLOPramide (REGLAN) 10 MG tablet Take 1 tablet (10 mg total) by mouth every 6 (six) hours as needed (nausea). (Patient not taking: Reported on 05/01/2018) 6 tablet 0  . OLANZapine (ZYPREXA) 15 MG tablet 35 mg (20 mg+ 15 mg) at night 90 tablet 0  . OLANZapine (ZYPREXA) 20 MG tablet 35 mg (20 mg + 15 mg)  at night 90 tablet 0  . polyethylene glycol  (MIRALAX / GLYCOLAX) packet Take 17 g by mouth daily.     No current facility-administered medications for this visit.     Musculoskeletal: Strength & Muscle Tone: N/A Gait & Station: N/A Patient leans: N/A  Psychiatric Specialty Exam: Review of Systems  There were no vitals taken for this visit.There is no height or weight on file to calculate BMI.  General Appearance: {Appearance:22683}  Eye Contact:  {BHH EYE CONTACT:22684}  Speech:  Clear and Coherent  Volume:  Normal  Mood:  {BHH MOOD:22306}  Affect:  {Affect (PAA):22687}  Thought Process:  Coherent  Orientation:  Full (Time, Place, and Person)  Thought Content: Logical   Suicidal Thoughts:  {ST/HT (PAA):22692}  Homicidal Thoughts:  {ST/HT (PAA):22692}  Memory:  Immediate;   Good  Judgement:  {Judgement (PAA):22694}  Insight:  {Insight (PAA):22695}  Psychomotor Activity:  Normal  Concentration:  Concentration: Good and Attention Span: Good  Recall:  Good  Fund of Knowledge: Good  Language: Good  Akathisia:  No  Handed:  Right  AIMS (if indicated): not done  Assets:  Communication Skills Desire for Improvement  ADL's:  Intact  Cognition: WNL  Sleep:  {BHH GOOD/FAIR/POOR:22877}   Screenings:   Assessment and Plan:  Nicholas Moyer is a 64 y.o. year old male with a history of schizophrenia, legally blind, who presents for follow up appointment for below.    1. Schizophrenia, unspecified type [facility]) Exam is notable for calmer affect, and he demonstrates linear thought process during the interview.  His sister denies any safety concern or behavior issues.  We will continue current dose of olanzapine to target schizophrenia.  Discussed potential metabolic side effect and EPS.  His sister will bring him to PCP to check any metabolic side effect.   2. Anxiety state He denies any significant anxiety since the last visit.  Will taper down lorazepam for anxiety to minimize potential side effect.    Plan  1.Continueolanzapine35mg  at night (worsening in provocative commentat lowerdose) 2.Decrease lorazepam 0.5  mg twice a day  3. Please obtain labs (lipid panels, glucose) and measure weight with his primary care doctor- he will be seen by PCP 4.Next appointment: 10/26 at 8:50 for 20 mins, video - Ms. Lovena Le, his sister to contact the office if any worsening in his symptoms.   The patient demonstrates the following risk factors for suicide: Chronic risk factors for suicide include:psychiatric disorder ofschizophrenia. Acute risk factorsfor suicide include: unemployment. Protective factorsfor this patient include: positive social support. Considering these factors, the overall suicide risk at this point appears to below. Patientisappropriate for outpatient follow up.   Norman Clay, MD 12/09/2019, 4:08 PM

## 2019-12-15 ENCOUNTER — Telehealth (HOSPITAL_COMMUNITY): Payer: Medicare Other | Admitting: Psychiatry

## 2019-12-21 NOTE — Progress Notes (Signed)
Virtual Visit via Video Note  I connected with Nicholas Moyer on 12/24/19 at  4:20 PM EDT by a video enabled telemedicine application and verified that I am speaking with the correct person using two identifiers.  Location: Patient: sister's home Provider: office   I discussed the limitations of evaluation and management by telemedicine and the availability of in person appointments. The patient expressed understanding and agreed to proceed.    I discussed the assessment and treatment plan with the patient. The patient was provided an opportunity to ask questions and all were answered. The patient agreed with the plan and demonstrated an understanding of the instructions.   The patient was advised to call back or seek an in-person evaluation if the symptoms worsen or if the condition fails to improve as anticipated.  I provided 15 minutes of non-face-to-face time during this encounter.   Norman Clay, MD     [facility] MD/PA/NP OP Progress Note  12/24/2019 4:47 PM [name]  MRN:  [id number]  Chief Complaint:  Chief Complaint    Follow-up; Schizophrenia     HPI:  -labs checked in 10/2019. Lipid wnl, Glu 105 This is a follow-up appointment for schizophrenia and anxiety.  He states that he had a good Halloween.  He thought about "Old Western day" He enjoys "thinking" and walks inside the house. He states that he came across the one he saw in jail.  When he is asked about paranoia, he states that he is concerned about "gay guys" who he met in jail. He feels safe inside the house.  He sleeps well.  He denies feeling depressed.  He denies anxiety.  He denies AH, VH.  He denies ideas of reference.   Nicholas Moyer, his sister presents to the interview.  She is concerned that his writing is not legible.  Although she does not know about his writings as she has not seen him for many years, Ms. Ann at the group home told her that his writing has been that way for the past few years.  She is  willing to try lower dose of olanzapine.  She denies any behavior concerns. She has not noticed any difference since tapering down lorazepam.    Visit Diagnosis:    ICD-10-CM   1. Schizophrenia, unspecified type ([place])  F20.9 LORazepam (ATIVAN) 0.5 MG tablet  2. Anxiety state  F41.1     Past Psychiatric History: Please see initial evaluation for full details. I have reviewed the history. No updates at this time.     Past Medical History:  Past Medical History:  Diagnosis Date  . Constipation   . Schizophrenia [facility])     Past Surgical History:  Procedure Laterality Date  . APPENDECTOMY    . COLONOSCOPY    . COLONOSCOPY WITH PROPOFOL N/A 08/05/2017   Procedure: COLONOSCOPY WITH PROPOFOL;  Surgeon: Lin Landsman, MD;  Location: [facility] ENDOSCOPY;  Service: Gastroenterology;  Laterality: N/A;    Family Psychiatric History: Please see initial evaluation for full details. I have reviewed the history. No updates at this time.     Family History:  Family History  Problem Relation Age of Onset  . Alcohol abuse Maternal Aunt     Social History:  Social History   Socioeconomic History  . Marital status: Single    Spouse name: Not on file  . Number of children: Not on file  . Years of education: Not on file  . Highest education level: Not on file  Occupational History  .  Not on file  Tobacco Use  . Smoking status: Never Smoker  . Smokeless tobacco: Never Used  Vaping Use  . Vaping Use: Never used  Substance and Sexual Activity  . Alcohol use: Not Currently  . Drug use: Not Currently  . Sexual activity: Never  Other Topics Concern  . Not on file  Social History Narrative  . Not on file   Social Determinants of Health   Financial Resource Strain:   . Difficulty of Paying Living Expenses: Not on file  Food Insecurity:   . Worried About Charity fundraiser in the Last Year: Not on file  . Ran Out of Food in the Last Year: Not on file  Transportation Needs:   .  Lack of Transportation (Medical): Not on file  . Lack of Transportation (Non-Medical): Not on file  Physical Activity:   . Days of Exercise per Week: Not on file  . Minutes of Exercise per Session: Not on file  Stress:   . Feeling of Stress : Not on file  Social Connections:   . Frequency of Communication with Friends and Family: Not on file  . Frequency of Social Gatherings with Friends and Family: Not on file  . Attends Religious Services: Not on file  . Active Member of Clubs or Organizations: Not on file  . Attends Archivist Meetings: Not on file  . Marital Status: Not on file    Allergies:  Allergies  Allergen Reactions  . Prolixin [Fluphenazine] Other (See Comments)    Pt states his muscles lock up  . Haloperidol Lactate Other (See Comments)    Muscles lock up.  . Thioridazine Hcl Other (See Comments)    Induces heart attack.    Metabolic Disorder Labs: No results found for: HGBA1C, MPG No results found for: PROLACTIN No results found for: CHOL, TRIG, HDL, CHOLHDL, VLDL, LDLCALC No results found for: TSH  Therapeutic Level Labs: No results found for: LITHIUM No results found for: VALPROATE No components found for:  CBMZ  Current Medications: Current Outpatient Medications  Medication Sig Dispense Refill  . docusate sodium (COLACE) 100 MG capsule Take 100 mg by mouth 2 (two) times daily.    Marland Kitchen loratadine (CLARITIN) 10 MG tablet Take 10 mg by mouth daily.     Marland Kitchen LORazepam (ATIVAN) 0.5 MG tablet Take 1 tablet (0.5 mg total) by mouth 2 (two) times daily. 60 tablet 1  . meclizine (ANTIVERT) 25 MG tablet Take 1 tablet (25 mg total) by mouth every 6 (six) hours as needed for dizziness. (Patient not taking: Reported on 05/01/2018) 20 tablet 0  . metoCLOPramide (REGLAN) 10 MG tablet Take 1 tablet (10 mg total) by mouth every 6 (six) hours as needed (nausea). (Patient not taking: Reported on 05/01/2018) 6 tablet 0  . OLANZapine (ZYPREXA) 15 MG tablet 35 mg (20 mg+ 15  mg) at night 90 tablet 0  . OLANZapine (ZYPREXA) 15 MG tablet Take 2 tablets (30 mg total) by mouth at bedtime. 60 tablet 1  . OLANZapine (ZYPREXA) 20 MG tablet 35 mg (20 mg + 15 mg)  at night 90 tablet 0  . polyethylene glycol (MIRALAX / GLYCOLAX) packet Take 17 g by mouth daily.     No current facility-administered medications for this visit.     Musculoskeletal: Strength & Muscle Tone: N/A Gait & Station: N/A Patient leans: N/A  Psychiatric Specialty Exam: Review of Systems  Psychiatric/Behavioral: Negative for agitation, behavioral problems, confusion, decreased concentration, dysphoric mood, hallucinations,  self-injury, sleep disturbance and suicidal ideas. The patient is not nervous/anxious and is not hyperactive.   All other systems reviewed and are negative.   There were no vitals taken for this visit.There is no height or weight on file to calculate BMI.  General Appearance: Fairly Groomed  Eye Contact:  Good  Speech:  Clear and Coherent  Volume:  Normal  Mood:  good  Affect:  Appropriate, Congruent and Restricted  Thought Process:  Coherent  Orientation:  Full (Time, Place, and Person)  Thought Content: Logical   Suicidal Thoughts:  No  Homicidal Thoughts:  No  Memory:  Immediate;   Good  Judgement:  Good  Insight:  Fair  Psychomotor Activity:  Normal  Concentration:  Concentration: Good and Attention Span: Good  Recall:  Good  Fund of Knowledge: Good  Language: Good  Akathisia:  No  Handed:  Right  AIMS (if indicated): not done  Assets:  Communication Skills Desire for Improvement  ADL's:  Intact  Cognition: WNL  Sleep:  Good   Screenings:   Assessment and Plan:  Nicholas Moyer is a 64 y.o. year old male with a history of schizophrenia, legally blind , who presents for follow up appointment for below.   1. Schizophrenia, unspecified type ([place]) There is no significant change since the last visit except vague paranoia (concern about person he met in  jail).  His sister denies any safety concern or behavior issues.  We will try lowering the dose of olanzapine again to minimize potential side effect especially given his sister's concern about his illegible writing.  Discussed potential metabolic side effect and EPS.   2. Anxiety state He denies significant anxiety since the last visit.  Will continue lorazepam as needed for anxiety.  Will consider tapering it off in the future.   Plan I have reviewed and updated plans as below 1. Decrease olanzapine 30 mg at night  2.Continue lorazepam 0.5  mg twice a day  3. Next appointment: 12/23 at 3 PM for 30 mins, video - Ms. Lovena Le, his sister to contact the office if any worsening in his symptoms.   The patient demonstrates the following risk factors for suicide: Chronic risk factors for suicide include:psychiatric disorder ofschizophrenia. Acute risk factorsfor suicide include: unemployment. Protective factorsfor this patient include: positive social support. Considering these factors, the overall suicide risk at this point appears to below. Patientisappropriate for outpatient follow up.   Norman Clay, MD 12/24/2019, 4:47 PM

## 2019-12-24 ENCOUNTER — Telehealth (HOSPITAL_COMMUNITY): Payer: Medicare Other | Admitting: Psychiatry

## 2019-12-24 ENCOUNTER — Encounter: Payer: Self-pay | Admitting: Psychiatry

## 2019-12-24 ENCOUNTER — Telehealth (INDEPENDENT_AMBULATORY_CARE_PROVIDER_SITE_OTHER): Payer: Medicare Other | Admitting: Psychiatry

## 2019-12-24 ENCOUNTER — Other Ambulatory Visit: Payer: Self-pay

## 2019-12-24 DIAGNOSIS — F411 Generalized anxiety disorder: Secondary | ICD-10-CM

## 2019-12-24 DIAGNOSIS — F209 Schizophrenia, unspecified: Secondary | ICD-10-CM | POA: Diagnosis not present

## 2019-12-24 MED ORDER — LORAZEPAM 0.5 MG PO TABS: 0.5000 mg | ORAL_TABLET | Freq: Two times a day (BID) | ORAL | 1 refills | Status: DC

## 2019-12-24 MED ORDER — OLANZAPINE 15 MG PO TABS: 30.0000 mg | ORAL_TABLET | Freq: Every day | ORAL | 1 refills | Status: DC

## 2019-12-25 ENCOUNTER — Other Ambulatory Visit: Payer: Self-pay | Admitting: Psychiatry

## 2019-12-25 ENCOUNTER — Telehealth: Payer: Self-pay

## 2019-12-25 MED ORDER — OLANZAPINE 10 MG PO TABS: ORAL_TABLET | ORAL | 1 refills | Status: DC

## 2019-12-25 MED ORDER — OLANZAPINE 20 MG PO TABS: ORAL_TABLET | ORAL | 1 refills | Status: DC

## 2019-12-25 NOTE — Telephone Encounter (Signed)
insurance will only cover a maximum daily dose of 1 tablet.   Please submit a new rx with updated dosage.   OLANZapine (ZYPREXA) 15 MG tablet Medication Date: 12/24/2019 Department: [facility] Ordering/Authorizing: Norman Clay, MD  Order Providers  Prescribing Provider Encounter Provider  Norman Clay, MD Norman Clay, MD  Outpatient Medication Detail   Disp Refills Start End   OLANZapine (ZYPREXA) 15 MG tablet 60 tablet 1 12/24/2019    Sig - Route: Take 2 tablets (30 mg total) by mouth at bedtime. - Oral   Sent to pharmacy as: OLANZapine (ZYPREXA) 15 MG tablet   E-Prescribing Status: Receipt confirmed by pharmacy (12/24/2019 4:42 PM EDT)

## 2019-12-25 NOTE — Telephone Encounter (Signed)
Ordered olanzapine 20 mg tab and 10 mg tab to take total of 30 mg at night.  - Please contact his sister to update this - Please contact the pharmacy to cancel 15 mg tabs orders.

## 2020-02-03 ENCOUNTER — Telehealth: Payer: Self-pay | Admitting: *Deleted

## 2020-02-03 ENCOUNTER — Other Ambulatory Visit: Payer: Self-pay | Admitting: Psychiatry

## 2020-02-03 MED ORDER — OLANZAPINE 2.5 MG PO TABS: ORAL_TABLET | ORAL | 0 refills | Status: DC

## 2020-02-03 NOTE — Telephone Encounter (Signed)
Will do!

## 2020-02-03 NOTE — Telephone Encounter (Signed)
It can be due to reducing the dose of olanzapine. Could you advise his sister to take olanzapine total of 32.5 mg at night? I ordered 2.5 mg tab to take along with 10 mg and 20 mg tab they already have.

## 2020-02-03 NOTE — Telephone Encounter (Signed)
Patients sister called and stated that patient reported he felt that bugs were crawling on his arms and face. Discussed whether or not decrease in Zyprexa could be causing this.  Please review.

## 2020-02-05 NOTE — Progress Notes (Signed)
Virtual Visit via Video Note  I connected with Nicholas Moyer on 02/11/20 at  3:00 PM EST by a video enabled telemedicine application and verified that I am speaking with the correct person using two identifiers.  Location: Patient: home Provider: office Persons participated in the visit- patient, provider, Nicholas Moyer, his sister to elaborate the story   I discussed the limitations of evaluation and management by telemedicine and the availability of in person appointments. The patient expressed understanding and agreed to proceed.   I discussed the assessment and treatment plan with the patient. The patient was provided an opportunity to ask questions and all were answered. The patient agreed with the plan and demonstrated an understanding of the instructions.   The patient was advised to call back or seek an in-person evaluation if the symptoms worsen or if the condition fails to improve as anticipated.  I provided 15 minutes of non-face-to-face time during this encounter.   Norman Clay, MD     [facility] MD/PA/NP OP Progress Note  02/11/2020 3:30 PM Nicholas Moyer  MRN:  [id number]  Chief Complaint:  Chief Complaint    Follow-up; Schizophrenia     HPI:  This is a follow-up appointment for schizophrenia and anxiety.  He states that he felt spiders are crawling when he was taking lower dose of olanzapine.  It has improved since up titration of olanzapine.  He had a good time on Thanksgiving with his sister.  He enjoyed meals and watching TV.  He quit writing as he is unable to see very well anymore.  He states that he feels somebody he met in jail might be following after him. However, he can do "nothing about it," which she referred to his decreased vision.  He feels safe at home.  He denies any hallucinations.  He sleeps well.  He denies feeling depressed.  He denies anxiety.  He denies SI, HI.  He denies Medical sales representative.   Nicholas Moyer, his sister presents to the interview.  She states  that she now knows that he needs olanzapine.  He had tactile hallucinations when he was on lower dose of olanzapine.  She did not see any other significant change.  He is doing well on the current dose.  He does not seem to be anxious.  She agrees to try giving him clonazepam only as needed for anxiety.  She denies any safety or behavioral concerns.    Daily routine: takes a walk, watches TV Support: Leslie Andrea, sister Household: sister (used to live at group home) Marital status: single Number of children: may have some children while he was in jail  150lbs Wt Readings from Last 3 Encounters:  05/01/18 148 lb (67.1 kg)  01/07/18 142 lb (64.4 kg)  10/07/17 137 lb (62.1 kg)    Visit Diagnosis:    ICD-10-CM   1. Schizophrenia, unspecified type ([place])  F20.9 LORazepam (ATIVAN) 0.5 MG tablet  2. Anxiety state  F41.1     Past Psychiatric History: Please see initial evaluation for full details. I have reviewed the history. No updates at this time.     Past Medical History:  Past Medical History:  Diagnosis Date  . Constipation   . Schizophrenia [facility])     Past Surgical History:  Procedure Laterality Date  . APPENDECTOMY    . COLONOSCOPY    . COLONOSCOPY WITH PROPOFOL N/A 08/05/2017   Procedure: COLONOSCOPY WITH PROPOFOL;  Surgeon: Lin Landsman, MD;  Location: [facility] ENDOSCOPY;  Service: Gastroenterology;  Laterality: N/A;  Family Psychiatric History: Please see initial evaluation for full details. I have reviewed the history. No updates at this time.     Family History:  Family History  Problem Relation Age of Onset  . Alcohol abuse Maternal Aunt     Social History:  Social History   Socioeconomic History  . Marital status: Single    Spouse name: Not on file  . Number of children: Not on file  . Years of education: Not on file  . Highest education level: Not on file  Occupational History  . Not on file  Tobacco Use  . Smoking status: Never Smoker  .  Smokeless tobacco: Never Used  Vaping Use  . Vaping Use: Never used  Substance and Sexual Activity  . Alcohol use: Not Currently  . Drug use: Not Currently  . Sexual activity: Never  Other Topics Concern  . Not on file  Social History Narrative  . Not on file   Social Determinants of Health   Financial Resource Strain: Not on file  Food Insecurity: Not on file  Transportation Needs: Not on file  Physical Activity: Not on file  Stress: Not on file  Social Connections: Not on file    Allergies:  Allergies  Allergen Reactions  . Prolixin [Fluphenazine] Other (See Comments)    Pt states his muscles lock up  . Haloperidol Lactate Other (See Comments)    Muscles lock up.  . Thioridazine Hcl Other (See Comments)    Induces heart attack.    Metabolic Disorder Labs: No results found for: HGBA1C, MPG No results found for: PROLACTIN No results found for: CHOL, TRIG, HDL, CHOLHDL, VLDL, LDLCALC No results found for: TSH  Therapeutic Level Labs: No results found for: LITHIUM No results found for: VALPROATE No components found for:  CBMZ  Current Medications: Current Outpatient Medications  Medication Sig Dispense Refill  . docusate sodium (COLACE) 100 MG capsule Take 100 mg by mouth 2 (two) times daily.    Marland Kitchen loratadine (CLARITIN) 10 MG tablet Take 10 mg by mouth daily.     Derrill Memo ON 03/03/2020] LORazepam (ATIVAN) 0.5 MG tablet Take 1 tablet (0.5 mg total) by mouth 2 (two) times daily as needed for anxiety. 60 tablet 1  . meclizine (ANTIVERT) 25 MG tablet Take 1 tablet (25 mg total) by mouth every 6 (six) hours as needed for dizziness. (Patient not taking: Reported on 05/01/2018) 20 tablet 0  . metoCLOPramide (REGLAN) 10 MG tablet Take 1 tablet (10 mg total) by mouth every 6 (six) hours as needed (nausea). (Patient not taking: Reported on 05/01/2018) 6 tablet 0  . [START ON 02/24/2020] OLANZapine (ZYPREXA) 10 MG tablet 32.5 mg at night(take 20 mg, 10 mg, 2.5 mg tab) 30 tablet 1  .  [START ON 03/05/2020] OLANZapine (ZYPREXA) 2.5 MG tablet 32.5 mg at night(take 20 mg, 10 mg, 2.5 mg tab) 30 tablet 1  . OLANZapine (ZYPREXA) 20 MG tablet 35 mg (20 mg + 15 mg)  at night 90 tablet 0  . [START ON 02/24/2020] OLANZapine (ZYPREXA) 20 MG tablet 32.5 mg at night(take 20 mg, 10 mg, 2.5 mg tab) 30 tablet 1  . polyethylene glycol (MIRALAX / GLYCOLAX) packet Take 17 g by mouth daily.     No current facility-administered medications for this visit.     Musculoskeletal: Strength & Muscle Tone: N/A Gait & Station: N/A Patient leans: N/A  Psychiatric Specialty Exam: Review of Systems  Psychiatric/Behavioral: Negative for agitation, behavioral problems, confusion, decreased concentration,  dysphoric mood, hallucinations, self-injury, sleep disturbance and suicidal ideas. The patient is not nervous/anxious and is not hyperactive.   All other systems reviewed and are negative.   There were no vitals taken for this visit.There is no height or weight on file to calculate BMI.  General Appearance: Fairly Groomed  Eye Contact:  Good  Speech:  Clear and Coherent  Volume:  Normal  Mood:  good  Affect:  Appropriate, Congruent and euthymic, slightly restricted  Thought Process:  Coherent  Orientation:  Full (Time, Place, and Person)  Thought Content: Logical   Suicidal Thoughts:  No  Homicidal Thoughts:  No  Memory:  Immediate;   Good  Judgement:  Good  Insight:  Fair  Psychomotor Activity:  Normal  Concentration:  Concentration: Good and Attention Span: Good  Recall:  Good  Fund of Knowledge: Good  Language: Good  Akathisia:  No  Handed:  Right  AIMS (if indicated): not done  Assets:  Communication Skills Desire for Improvement  ADL's:  Intact  Cognition: WNL  Sleep:  Good   Screenings:   Assessment and Plan:  Burwell Bethel is a 64 y.o. year old male with a history of schizophrenia, legally blind, who presents for follow up appointment for below.   1. Schizophrenia,  unspecified type ([place]) He had worsening in tactile hallucinations when tried to taper down olanzapine, which has subsided since up titration of olanzapine.  Although he reports vague paranoia of him being followed by people in jail, he denies any other psychotic symptoms.  Will continue current dose of olanzapine to target schizophrenia.  Discussed potential metabolic side effect and EPS.   2. Anxiety state He denies any anxiety since the last visit.  His sister agrees to try this medication as needed for anxiety rather than scheduled.    Plan 1. Continue olanzapine 32.5 mg at night (worsening in tactile hallucination at 30 mg) 2.Change : lorazepam0.26m twice a day as needed for anxiety  3. Next appointment: 2/17 at 3 PMfor 30 mins, video - Ms. TLovena Le his sister to contact the office if any worsening in his symptoms.   The patient demonstrates the following risk factors for suicide: Chronic risk factors for suicide include:psychiatric disorder ofschizophrenia. Acute risk factorsfor suicide include: unemployment. Protective factorsfor this patient include: positive social support. Considering these factors, the overall suicide risk at this point appears to below. Patientisappropriate for outpatient follow up.  RNorman Clay MD 02/11/2020, 3:30 PM

## 2020-02-11 ENCOUNTER — Encounter: Payer: Self-pay | Admitting: Psychiatry

## 2020-02-11 ENCOUNTER — Telehealth (INDEPENDENT_AMBULATORY_CARE_PROVIDER_SITE_OTHER): Payer: Medicare Other | Admitting: Psychiatry

## 2020-02-11 ENCOUNTER — Other Ambulatory Visit: Payer: Self-pay

## 2020-02-11 DIAGNOSIS — F411 Generalized anxiety disorder: Secondary | ICD-10-CM

## 2020-02-11 DIAGNOSIS — F209 Schizophrenia, unspecified: Secondary | ICD-10-CM

## 2020-02-11 MED ORDER — OLANZAPINE 20 MG PO TABS: ORAL_TABLET | ORAL | 1 refills | Status: DC

## 2020-02-11 MED ORDER — LORAZEPAM 0.5 MG PO TABS: 0.5000 mg | ORAL_TABLET | Freq: Two times a day (BID) | ORAL | 1 refills | Status: DC | PRN

## 2020-02-11 MED ORDER — OLANZAPINE 10 MG PO TABS: ORAL_TABLET | ORAL | 1 refills | Status: DC

## 2020-02-11 MED ORDER — OLANZAPINE 2.5 MG PO TABS: ORAL_TABLET | ORAL | 1 refills | Status: DC

## 2020-02-24 ENCOUNTER — Telehealth: Payer: Self-pay

## 2020-02-24 NOTE — Telephone Encounter (Signed)
Medication management - Telephone call with pt's sister as she reported she has not been giving pt many Lorazepam as they have tried to cut back per Dr. Bing Matter instructions and wanted to verify he still has an order for PRN when they run out this month.  Collateral stated she is giving it to patients at times but verified they are never taking more than twice a day and that he still has medications at this time.  Collateral will call back if any problems with refill schedule for on or after 03/03/20 or if any problems.

## 2020-03-03 ENCOUNTER — Encounter: Payer: Self-pay | Admitting: Psychiatry

## 2020-03-03 ENCOUNTER — Other Ambulatory Visit: Payer: Self-pay

## 2020-03-03 ENCOUNTER — Telehealth (INDEPENDENT_AMBULATORY_CARE_PROVIDER_SITE_OTHER): Payer: Medicare Other | Admitting: Psychiatry

## 2020-03-03 DIAGNOSIS — F209 Schizophrenia, unspecified: Secondary | ICD-10-CM

## 2020-03-03 DIAGNOSIS — F411 Generalized anxiety disorder: Secondary | ICD-10-CM

## 2020-03-03 NOTE — Progress Notes (Signed)
Virtual Visit via Video Note  I connected with Nicholas Moyer on 03/03/20 at 11:00 AM EST by a video enabled telemedicine application and verified that I am speaking with the correct person using two identifiers.  Location: Patient: home Provider: office Persons participated in the visit- patient, provider, Chrys Racer, his sister to elaborate history   I discussed the limitations of evaluation and management by telemedicine and the availability of in person appointments. The patient expressed understanding and agreed to proceed.     I discussed the assessment and treatment plan with the patient. The patient was provided an opportunity to ask questions and all were answered. The patient agreed with the plan and demonstrated an understanding of the instructions.   The patient was advised to call back or seek an in-person evaluation if the symptoms worsen or if the condition fails to improve as anticipated.  I provided 20 minutes of non-face-to-face time during this encounter.   Norman Clay, MD     [facility] MD/PA/NP OP Progress Note  03/03/2020 11:44 AM Nicholas Moyer  MRN:  [id number]  Chief Complaint:  Chief Complaint    Follow-up; Schizophrenia     HPI:  This is a follow-up appointment for schizophrenia.  This appointment was made urgently due to the concern from his sister.   He started, stating that he does not comfortable there.  He will rather going back to jail.  He states that "whole thing is not the same." "..bible, but we are reading from different page."  He feels that "they' are trying to find him.  However, he states that they do not know what he looks like, and he feels comfortable staying with his sister until they find him.  When he is asked about "they," he talks about a man whose nickname is "blue," who is a gay guy, whom he saw 2 years and six months ago.  He says "it is safe and reasonable as it can be" when he is asked about staying with his sister.  He has insomnia.   He denies feeling depressed.  He denies AH, VH.  He denies ideas of reference.  He feels comfortable continuing his medication.  Noted that he complains of back pain during the interview.   Chrys Racer, his sister presents to the interview.  She held lorazepam December 27 through January 4. She restarted medication after the event; he insisted to get some cereal as he wants to get vitamin E. When his sister tries to ask his provider about this, he said "I know that's the problem, I am your brother." It was terrifying experience for Nicholas Moyer.  She thinks he is not reasonable, and states that he wants to go back to prison.  Although he occasionally attends adult daycare, he declines going there at times.  She states that he has authoritative voice, although he is not aggressive due to his decreased vision.  She denies safety concern at this time, although she is concerned about him.  She agrees to increase of the dose of olanzapine.   Visit Diagnosis:    ICD-10-CM   1. Schizophrenia, unspecified type ([place])  F20.9   2. Anxiety state  F41.1     Past Psychiatric History: Please see initial evaluation for full details. I have reviewed the history. No updates at this time.     Past Medical History:  Past Medical History:  Diagnosis Date  . Constipation   . Schizophrenia [facility])     Past Surgical History:  Procedure Laterality Date  .  APPENDECTOMY    . COLONOSCOPY    . COLONOSCOPY WITH PROPOFOL N/A 08/05/2017   Procedure: COLONOSCOPY WITH PROPOFOL;  Surgeon: Lin Landsman, MD;  Location: [facility] ENDOSCOPY;  Service: Gastroenterology;  Laterality: N/A;    Family Psychiatric History: Please see initial evaluation for full details. I have reviewed the history. No updates at this time.     Family History:  Family History  Problem Relation Age of Onset  . Alcohol abuse Maternal Aunt     Social History:  Social History   Socioeconomic History  . Marital status: Single    Spouse name: Not  on file  . Number of children: Not on file  . Years of education: Not on file  . Highest education level: Not on file  Occupational History  . Not on file  Tobacco Use  . Smoking status: Never Smoker  . Smokeless tobacco: Never Used  Vaping Use  . Vaping Use: Never used  Substance and Sexual Activity  . Alcohol use: Not Currently  . Drug use: Not Currently  . Sexual activity: Never  Other Topics Concern  . Not on file  Social History Narrative  . Not on file   Social Determinants of Health   Financial Resource Strain: Not on file  Food Insecurity: Not on file  Transportation Needs: Not on file  Physical Activity: Not on file  Stress: Not on file  Social Connections: Not on file    Allergies:  Allergies  Allergen Reactions  . Prolixin [Fluphenazine] Other (See Comments)    Pt states his muscles lock up  . Haloperidol Lactate Other (See Comments)    Muscles lock up.  . Thioridazine Hcl Other (See Comments)    Induces heart attack.    Metabolic Disorder Labs: No results found for: HGBA1C, MPG No results found for: PROLACTIN No results found for: CHOL, TRIG, HDL, CHOLHDL, VLDL, LDLCALC No results found for: TSH  Therapeutic Level Labs: No results found for: LITHIUM No results found for: VALPROATE No components found for:  CBMZ  Current Medications: Current Outpatient Medications  Medication Sig Dispense Refill  . docusate sodium (COLACE) 100 MG capsule Take 100 mg by mouth 2 (two) times daily.    Marland Kitchen loratadine (CLARITIN) 10 MG tablet Take 10 mg by mouth daily.     Marland Kitchen LORazepam (ATIVAN) 0.5 MG tablet Take 1 tablet (0.5 mg total) by mouth 2 (two) times daily as needed for anxiety. 60 tablet 1  . meclizine (ANTIVERT) 25 MG tablet Take 1 tablet (25 mg total) by mouth every 6 (six) hours as needed for dizziness. (Patient not taking: Reported on 05/01/2018) 20 tablet 0  . metoCLOPramide (REGLAN) 10 MG tablet Take 1 tablet (10 mg total) by mouth every 6 (six) hours as  needed (nausea). (Patient not taking: Reported on 05/01/2018) 6 tablet 0  . OLANZapine (ZYPREXA) 10 MG tablet 32.5 mg at night(take 20 mg, 10 mg, 2.5 mg tab) 30 tablet 1  . [START ON 03/05/2020] OLANZapine (ZYPREXA) 2.5 MG tablet 32.5 mg at night(take 20 mg, 10 mg, 2.5 mg tab) 30 tablet 1  . OLANZapine (ZYPREXA) 20 MG tablet 32.5 mg at night(take 20 mg, 10 mg, 2.5 mg tab) 30 tablet 1  . polyethylene glycol (MIRALAX / GLYCOLAX) packet Take 17 g by mouth daily.     No current facility-administered medications for this visit.     Musculoskeletal: Strength & Muscle Tone: N/A Gait & Station: N/A Patient leans: N/A  Psychiatric Specialty Exam: Review of  Systems  Musculoskeletal: Positive for back pain.  Psychiatric/Behavioral: Positive for behavioral problems and sleep disturbance. Negative for agitation, confusion, decreased concentration, dysphoric mood, hallucinations, self-injury and suicidal ideas. The patient is not nervous/anxious and is not hyperactive.   All other systems reviewed and are negative.   There were no vitals taken for this visit.There is no height or weight on file to calculate BMI.  General Appearance: Fairly Groomed  Eye Contact:  Good  Speech:  Clear and Coherent  Volume:  Normal  Mood:  not depressed  Affect:  Restricted  Thought Process:  Coherent  Orientation:  Full (Time, Place, and Person)  Thought Content: Illogical and Paranoid Ideation   Suicidal Thoughts:  No  Homicidal Thoughts:  No  Memory:  Immediate;   Good  Judgement:  Impaired  Insight:  Lacking  Psychomotor Activity:  Normal  Concentration:  Concentration: Good and Attention Span: Good  Recall:  Good  Fund of Knowledge: Good  Language: Good  Akathisia:  No  Handed:  Right  AIMS (if indicated): not done  Assets:  Social Support  ADL's:  Intact  Cognition: WNL  Sleep:  Poor   Screenings:   Assessment and Plan:  Nicholas Moyer is a 65 y.o. year old male with a history of  schizophrenia, legally blind, who presents for follow up appointment for below.   1. Schizophrenia, unspecified type ([place]) 2. Anxiety state He has had worsening in disorganization, since the last visit, which coincided with tapering off lorazepam.  Noted that he has been on lower dose of olanzapine, which may have been contributing to the current condition.  Will uptitrate olanzapine to optimize treatment for schizophrenia.  Discussed potential metabolic side effect and EPS.  Will reinitiate lorazepam given his sister reports some benefit for his irritability.  Will have sooner visit to monitor his symptoms.    Plan 1. Increase olanzapine 35 mg  2. Reinitiate lorazepam 0.5 mg twice a day  3. Next appointment:1/20 at 10:00 AM for 30 mins, video - Ms. Lovena Le, his sister to contact the office if any worsening in his symptoms.  The patient demonstrates the following risk factors for suicide: Chronic risk factors for suicide include:psychiatric disorder ofschizophrenia. Acute risk factorsfor suicide include: unemployment. Protective factorsfor this patient include: positive social support. Considering these factors, the overall suicide risk at this point appears to below. Patientisappropriate for outpatient follow up.  Norman Clay, MD 03/03/2020, 11:44 AM

## 2020-03-07 NOTE — Progress Notes (Signed)
Virtual Visit via Video Note  I connected with Nicholas Moyer on 03/10/20 at 10:00 AM EST by a video enabled telemedicine application and verified that I am speaking with the correct person using two identifiers.  Location: Patient: home Provider: office Persons participated in the visit- patient, provider, Caroline/his sister/caregiver to elaborate story   I discussed the limitations of evaluation and management by telemedicine and the availability of in person appointments. The patient expressed understanding and agreed to proceed.    I discussed the assessment and treatment plan with the patient. The patient was provided an opportunity to ask questions and all were answered. The patient agreed with the plan and demonstrated an understanding of the instructions.   The patient was advised to call back or seek an in-person evaluation if the symptoms worsen or if the condition fails to improve as anticipated.  I provided 15 minutes of non-face-to-face time during this encounter.   Norman Clay, MD    [facility] MD/PA/NP OP Progress Note  03/10/2020 10:34 AM Nicholas Moyer  MRN:  [id number]  Chief Complaint:  Chief Complaint    Follow-up; Schizophrenia; Anxiety     HPI:  This is a follow-up appointment for schizophrenia and anxiety.  He states that he has been doing good.  He does not recall the conversation at the last visit.  He is breakfast in the morning, watching TV until noon.  He stays in the house the rest of the day.  Although he does not want to go outside as it is cold, he agrees to try going in a big yard when the weather is warmer.  When he is asked about the concern of the people who he met in jail, he states that he denies concern about them.  However, when he is asked about any paranoia, he later states that he is unsure whether ""blue "is after me."  When he is asked about HI, he talks about the guy, who is gay, who he declined to do something with him.  When he is asked if  he has HI, he later denies this, stating that he has not thought about this person for a while.  He does not want to go to daycare as he is unable to see anything.  He is unable to draw, write, read or do coloring, and he wants to see things.  He denies insomnia.  He has good appetite.  He denies feeling anxious.  He denies AH, VH.  He denies ideas of reference.   [name], his sister presents to the interview.  She states that he has been doing better, although he is not the same after reducing the medication.  She wants to continue the current medication regimen.  She states that although he was the one who said that he wants to go to adult daycare, he now declines to do so as there is again who is trying to have sex with him, and "Blue" who is trying to get him. She denies any aggression or safety concern.  He takes medication regularly.    Daily routine: takes a walk, watches TV Support: Leslie Andrea, sister Household: sister (used to live at group home) Marital status: single Number of children: may have some children while he was in jail  Visit Diagnosis:    ICD-10-CM   1. Schizophrenia, unspecified type ([place])  F20.9   2. Anxiety state  F41.1     Past Psychiatric History: Please see initial evaluation for full details. I have  reviewed the history. No updates at this time.     Past Medical History:  Past Medical History:  Diagnosis Date  . Constipation   . Schizophrenia [facility])     Past Surgical History:  Procedure Laterality Date  . APPENDECTOMY    . COLONOSCOPY    . COLONOSCOPY WITH PROPOFOL N/A 08/05/2017   Procedure: COLONOSCOPY WITH PROPOFOL;  Surgeon: Lin Landsman, MD;  Location: [facility] ENDOSCOPY;  Service: Gastroenterology;  Laterality: N/A;    Family Psychiatric History: Please see initial evaluation for full details. I have reviewed the history. No updates at this time.     Family History:  Family History  Problem Relation Age of Onset  . Alcohol  abuse Maternal Aunt     Social History:  Social History   Socioeconomic History  . Marital status: Single    Spouse name: Not on file  . Number of children: Not on file  . Years of education: Not on file  . Highest education level: Not on file  Occupational History  . Not on file  Tobacco Use  . Smoking status: Never Smoker  . Smokeless tobacco: Never Used  Vaping Use  . Vaping Use: Never used  Substance and Sexual Activity  . Alcohol use: Not Currently  . Drug use: Not Currently  . Sexual activity: Never  Other Topics Concern  . Not on file  Social History Narrative  . Not on file   Social Determinants of Health   Financial Resource Strain: Not on file  Food Insecurity: Not on file  Transportation Needs: Not on file  Physical Activity: Not on file  Stress: Not on file  Social Connections: Not on file    Allergies:  Allergies  Allergen Reactions  . Prolixin [Fluphenazine] Other (See Comments)    Pt states his muscles lock up  . Haloperidol Lactate Other (See Comments)    Muscles lock up.  . Thioridazine Hcl Other (See Comments)    Induces heart attack.    Metabolic Disorder Labs: No results found for: HGBA1C, MPG No results found for: PROLACTIN No results found for: CHOL, TRIG, HDL, CHOLHDL, VLDL, LDLCALC No results found for: TSH  Therapeutic Level Labs: No results found for: LITHIUM No results found for: VALPROATE No components found for:  CBMZ  Current Medications: Current Outpatient Medications  Medication Sig Dispense Refill  . OLANZapine (ZYPREXA) 15 MG tablet Total of 35 mg at night. Take along with 20 mg tab 30 tablet 0  . docusate sodium (COLACE) 100 MG capsule Take 100 mg by mouth 2 (two) times daily.    Marland Kitchen loratadine (CLARITIN) 10 MG tablet Take 10 mg by mouth daily.     Marland Kitchen LORazepam (ATIVAN) 0.5 MG tablet Take 1 tablet (0.5 mg total) by mouth 2 (two) times daily as needed for anxiety. 60 tablet 1  . meclizine (ANTIVERT) 25 MG tablet Take 1  tablet (25 mg total) by mouth every 6 (six) hours as needed for dizziness. (Patient not taking: Reported on 05/01/2018) 20 tablet 0  . metoCLOPramide (REGLAN) 10 MG tablet Take 1 tablet (10 mg total) by mouth every 6 (six) hours as needed (nausea). (Patient not taking: Reported on 05/01/2018) 6 tablet 0  . OLANZapine (ZYPREXA) 20 MG tablet 32.5 mg at night(take 20 mg, 10 mg, 2.5 mg tab) 30 tablet 1  . polyethylene glycol (MIRALAX / GLYCOLAX) packet Take 17 g by mouth daily.     No current facility-administered medications for this visit.  Musculoskeletal: Strength & Muscle Tone: N/A Gait & Station: N?A Patient leans: N/A  Psychiatric Specialty Exam: Review of Systems  Psychiatric/Behavioral: Negative for agitation, behavioral problems, confusion, decreased concentration, dysphoric mood, hallucinations, self-injury, sleep disturbance and suicidal ideas. The patient is not nervous/anxious and is not hyperactive.   All other systems reviewed and are negative.   There were no vitals taken for this visit.There is no height or weight on file to calculate BMI.  General Appearance: Fairly Groomed  Eye Contact:  Good  Speech:  Clear and Coherent  Volume:  Normal  Mood:  good  Affect:  Appropriate, Congruent, Restricted and calmer  Thought Process:  Coherent, disorganized at times  Orientation:  Full (Time, Place, and Person)  Thought Content: Logical   Suicidal Thoughts:  No  Homicidal Thoughts:  No  Memory:  Immediate;   Good  Judgement:  Fair  Insight:  Lacking  Psychomotor Activity:  Normal  Concentration:  Concentration: Good and Attention Span: Good  Recall:  Poor  Fund of Knowledge: Good  Language: Good  Akathisia:  No  Handed:  Right  AIMS (if indicated): not done  Assets:  Social Support  ADL's:  Intact  Cognition: WNL  Sleep:  Good   Screenings:   Assessment and Plan:  Trinten Boudoin is a 65 y.o. year old male with a history of schizophrenia, legally blind, who  presents for follow up appointment for below.   1. Schizophrenia, unspecified type ([place]) 2. Anxiety state There has been overall improvement in disorganization since up titration of olanzapine.  Will continue current dose of olanzapine to target schizophrenia.  Discussed potential metabolic side effect and EPS.  Will continue lorazepam as needed for anxiety and irritability.  Discussed potential risk of drowsiness.   Plan 1. Continue olanzapine 35 mg - worsening in paranoia on lower dose 2. Continue lorazepam 0.5 mg twice a day - one refill left 3. Next appointment:2/17 at 11 AM for 30 mins, video  The patient demonstrates the following risk factors for suicide: Chronic risk factors for suicide include:psychiatric disorder ofschizophrenia. Acute risk factorsfor suicide include: unemployment. Protective factorsfor this patient include: positive social support. Considering these factors, the overall suicide risk at this point appears to below. Patientisappropriate for outpatient follow up.  Norman Clay, MD 03/10/2020, 10:34 AM

## 2020-03-10 ENCOUNTER — Encounter: Payer: Self-pay | Admitting: Psychiatry

## 2020-03-10 ENCOUNTER — Other Ambulatory Visit: Payer: Self-pay

## 2020-03-10 ENCOUNTER — Telehealth (INDEPENDENT_AMBULATORY_CARE_PROVIDER_SITE_OTHER): Payer: Medicare Other | Admitting: Psychiatry

## 2020-03-10 DIAGNOSIS — F411 Generalized anxiety disorder: Secondary | ICD-10-CM

## 2020-03-10 DIAGNOSIS — F209 Schizophrenia, unspecified: Secondary | ICD-10-CM | POA: Diagnosis not present

## 2020-03-10 MED ORDER — OLANZAPINE 15 MG PO TABS: ORAL_TABLET | ORAL | 0 refills | Status: DC

## 2020-03-10 NOTE — Patient Instructions (Signed)
1. Continue olanzapine 35 mg  2. Continue lorazepam 0.5 mg twice a day  3. Next appointment:2/17 at 11 AM

## 2020-04-01 NOTE — Progress Notes (Signed)
Virtual Visit via Video Note  I connected with Nicholas Moyer on 04/07/20 at 11:00 AM EST by a video enabled telemedicine application and verified that I am speaking with the correct person using two identifiers.  Location: Patient: home Provider: office Persons participated in the visit- patient, provider   I discussed the limitations of evaluation and management by telemedicine and the availability of in person appointments. The patient expressed understanding and agreed to proceed.   I discussed the assessment and treatment plan with the patient. The patient was provided an opportunity to ask questions and all were answered. The patient agreed with the plan and demonstrated an understanding of the instructions.   The patient was advised to call back or seek an in-person evaluation if the symptoms worsen or if the condition fails to improve as anticipated.  I provided 16 minutes of non-face-to-face time during this encounter.   Norman Clay, MD    [facility] MD/PA/NP OP Progress Note  04/07/2020 11:27 AM Nicholas Moyer  MRN:  [id number]  Chief Complaint:  Chief Complaint    Follow-up; Schizophrenia     HPI:  This is a follow-up appointment for schizophrenia and anxiety.  He started the interview, stating happy Valentine's Day.  He has been doing well.  He thinks about the music.  He likes any sorts of music.  He is interested in taking a walk again when it is warmer.  He is not interested in going to day program as he was not sure anything in common.  He states that although he would hook up with people who were interesting in music, he thinks they are just coming for free meal.  When he is asked if he has any questions, he states that Ms. Elza Rafter, who he met in the year ago is looking for good looking lady to make her look better.  He feels comfortable staying on his medication.  He denies AH, VH.  He denies paranoia, stating that they need to find him first.  He denies feeling  depressed.  He denies SI.  He denies anxiety or panic attacks.  Nicholas Moyer, his sister presents to the interview.  She states that he has been doing well since being on his current medication.  Although he occasionally rocks in the chair, it happens only occasionally.  She denies any safety concerns or aggression.  He takes medication regularly.    Daily routine:takes a walk, watches TV Support:Caroline Lovena Le, sister Household:sister (used to live at group home) Marital status:single Number of children:may have some children while he was in jail  Visit Diagnosis:    ICD-10-CM   1. Schizophrenia, unspecified type ([place])  F20.9 LORazepam (ATIVAN) 0.5 MG tablet  2. Anxiety state  F41.1     Past Psychiatric History: Please see initial evaluation for full details. I have reviewed the history. No updates at this time.     Past Medical History:  Past Medical History:  Diagnosis Date  . Constipation   . Schizophrenia [facility])     Past Surgical History:  Procedure Laterality Date  . APPENDECTOMY    . COLONOSCOPY    . COLONOSCOPY WITH PROPOFOL N/A 08/05/2017   Procedure: COLONOSCOPY WITH PROPOFOL;  Surgeon: Lin Landsman, MD;  Location: [facility] ENDOSCOPY;  Service: Gastroenterology;  Laterality: N/A;    Family Psychiatric History: Please see initial evaluation for full details. I have reviewed the history. No updates at this time.     Family History:  Family History  Problem Relation Age  of Onset  . Alcohol abuse Maternal Aunt     Social History:  Social History   Socioeconomic History  . Marital status: Single    Spouse name: Not on file  . Number of children: Not on file  . Years of education: Not on file  . Highest education level: Not on file  Occupational History  . Not on file  Tobacco Use  . Smoking status: Never Smoker  . Smokeless tobacco: Never Used  Vaping Use  . Vaping Use: Never used  Substance and Sexual Activity  . Alcohol use: Not Currently  .  Drug use: Not Currently  . Sexual activity: Never  Other Topics Concern  . Not on file  Social History Narrative  . Not on file   Social Determinants of Health   Financial Resource Strain: Not on file  Food Insecurity: Not on file  Transportation Needs: Not on file  Physical Activity: Not on file  Stress: Not on file  Social Connections: Not on file    Allergies:  Allergies  Allergen Reactions  . Prolixin [Fluphenazine] Other (See Comments)    Pt states his muscles lock up  . Haloperidol Lactate Other (See Comments)    Muscles lock up.  . Thioridazine Hcl Other (See Comments)    Induces heart attack.    Metabolic Disorder Labs: No results found for: HGBA1C, MPG No results found for: PROLACTIN No results found for: CHOL, TRIG, HDL, CHOLHDL, VLDL, LDLCALC No results found for: TSH  Therapeutic Level Labs: No results found for: LITHIUM No results found for: VALPROATE No components found for:  CBMZ  Current Medications: Current Outpatient Medications  Medication Sig Dispense Refill  . docusate sodium (COLACE) 100 MG capsule Take 100 mg by mouth 2 (two) times daily.    Marland Kitchen loratadine (CLARITIN) 10 MG tablet Take 10 mg by mouth daily.     Derrill Memo ON 05/02/2020] LORazepam (ATIVAN) 0.5 MG tablet Take 1 tablet (0.5 mg total) by mouth 2 (two) times daily as needed for anxiety. 60 tablet 2  . meclizine (ANTIVERT) 25 MG tablet Take 1 tablet (25 mg total) by mouth every 6 (six) hours as needed for dizziness. (Patient not taking: Reported on 05/01/2018) 20 tablet 0  . metoCLOPramide (REGLAN) 10 MG tablet Take 1 tablet (10 mg total) by mouth every 6 (six) hours as needed (nausea). (Patient not taking: Reported on 05/01/2018) 6 tablet 0  . OLANZapine (ZYPREXA) 15 MG tablet Total of 35 mg at night. Take along with 20 mg tab 30 tablet 2  . OLANZapine (ZYPREXA) 20 MG tablet 32.5 mg at night(take 20 mg, 10 mg, 2.5 mg tab) 30 tablet 2  . polyethylene glycol (MIRALAX / GLYCOLAX) packet Take 17  g by mouth daily.     No current facility-administered medications for this visit.     Musculoskeletal: Strength & Muscle Tone: N/A Gait & Station: N/A Patient leans: N/A  Psychiatric Specialty Exam: Review of Systems  Psychiatric/Behavioral: Negative for agitation, behavioral problems, confusion, decreased concentration, dysphoric mood, hallucinations, self-injury, sleep disturbance and suicidal ideas. The patient is not nervous/anxious and is not hyperactive.   All other systems reviewed and are negative.   There were no vitals taken for this visit.There is no height or weight on file to calculate BMI.  General Appearance: Fairly Groomed  Eye Contact:  Good  Speech:  Clear and Coherent  Volume:  Normal  Mood:  good  Affect:  Appropriate, Congruent and euthymic  Thought Process:  Coherent and Disorganized  Orientation:  Full (Time, Place, and Person)  Thought Content: no paranoia   Suicidal Thoughts:  No  Homicidal Thoughts:  No  Memory:  Immediate;   Good  Judgement:  Good  Insight:  Present  Psychomotor Activity:  Normal  Concentration:  Concentration: Good and Attention Span: Good  Recall:  Good  Fund of Knowledge: Good  Language: Good  Akathisia:  No  Handed:  Right  AIMS (if indicated): not done  Assets:  Communication Skills Desire for Improvement  ADL's:  Intact  Cognition: WNL  Sleep:  Good   Screenings: PHQ2-9   Flowsheet Row Video Visit from 04/07/2020 in [place]  PHQ-2 Total Score 0    Flowsheet Row Video Visit from 04/07/2020 in [place] No Risk       Assessment and Plan:  Jocob Dambach is a 65 y.o. year old male with a history of schizophrenia, legally blind , who presents for follow up appointment for below.   1. Schizophrenia, unspecified type ([place]) 2. Anxiety state Although exam is notable for occasional disorganization, he demonstrates calm denominator,  and his sister denies any behavior issues since the last visit.  We will continue current dose of olanzapine to target schizophrenia.  Discussed potential metabolic side effect and EPS.  Will continue lorazepam as needed for anxiety and irritability.  Discussed potential risk of drowsiness.  There has been steady improvement in disorganization  Plan I have reviewed and updated plans as below 1. Continue olanzapine 35 mg - worsening in paranoia on lower dose 2. Continue lorazepam 0.5 mg twice a day  3.Next appointment:5/12 at 22 AM for 30 mins, video - labs done by PCP 10/2019 for lipid, BS   The patient demonstrates the following risk factors for suicide: Chronic risk factors for suicide include:psychiatric disorder ofschizophrenia. Acute risk factorsfor suicide include: unemployment. Protective factorsfor this patient include: positive social support. Considering these factors, the overall suicide risk at this point appears to below. Patientisappropriate for outpatient follow up.   Norman Clay, MD 04/07/2020, 11:27 AM

## 2020-04-07 ENCOUNTER — Telehealth (INDEPENDENT_AMBULATORY_CARE_PROVIDER_SITE_OTHER): Payer: Medicare Other | Admitting: Psychiatry

## 2020-04-07 ENCOUNTER — Other Ambulatory Visit: Payer: Self-pay

## 2020-04-07 ENCOUNTER — Encounter: Payer: Self-pay | Admitting: Psychiatry

## 2020-04-07 DIAGNOSIS — F411 Generalized anxiety disorder: Secondary | ICD-10-CM

## 2020-04-07 DIAGNOSIS — F209 Schizophrenia, unspecified: Secondary | ICD-10-CM

## 2020-04-07 MED ORDER — OLANZAPINE 20 MG PO TABS: ORAL_TABLET | ORAL | 2 refills | Status: DC

## 2020-04-07 MED ORDER — OLANZAPINE 15 MG PO TABS: ORAL_TABLET | ORAL | 2 refills | Status: DC

## 2020-04-07 MED ORDER — LORAZEPAM 0.5 MG PO TABS: 0.5000 mg | ORAL_TABLET | Freq: Two times a day (BID) | ORAL | 2 refills | Status: AC | PRN
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
## 2020-05-10 ENCOUNTER — Encounter: Payer: Self-pay | Admitting: Psychiatry

## 2020-05-10 ENCOUNTER — Other Ambulatory Visit: Payer: Self-pay

## 2020-05-10 ENCOUNTER — Telehealth (INDEPENDENT_AMBULATORY_CARE_PROVIDER_SITE_OTHER): Payer: Medicare Other | Admitting: Psychiatry

## 2020-05-10 DIAGNOSIS — F209 Schizophrenia, unspecified: Secondary | ICD-10-CM

## 2020-05-10 DIAGNOSIS — F411 Generalized anxiety disorder: Secondary | ICD-10-CM

## 2020-05-10 MED ORDER — OLANZAPINE 20 MG PO TABS: ORAL_TABLET | ORAL | 4 refills | Status: AC

## 2020-05-10 MED ORDER — OLANZAPINE 15 MG PO TABS: ORAL_TABLET | ORAL | 4 refills | Status: AC

## 2020-06-24 NOTE — Progress Notes (Signed)
Virtual Visit via Video Note  I connected with Nicholas Moyer on 06/30/20 at 11:00 AM EDT by a video enabled telemedicine application and verified that I am speaking with the correct person using two identifiers.  Location: Patient: home Provider: office Persons participated in the visit- patient, provider   I discussed the limitations of evaluation and management by telemedicine and the availability of in person appointments. The patient expressed understanding and agreed to proceed.   I discussed the assessment and treatment plan with the patient. The patient was provided an opportunity to ask questions and all were answered. The patient agreed with the plan and demonstrated an understanding of the instructions.   The patient was advised to call back or seek an in-person evaluation if the symptoms worsen or if the condition fails to improve as anticipated.  I provided 15 minutes of non-face-to-face time during this encounter.   Norman Clay, MD    [facility] MD/PA/NP OP Progress Note  06/30/2020 11:35 AM Nicholas Moyer  MRN:  [id number]  Chief Complaint:  Chief Complaint    Follow-up; Schizophrenia     HPI:  This is a follow-up appointment for schizophrenia.  He states that he has been doing fine.  He enjoys writing short story or her song.  He also enjoys listening to music.  He sits in the front porch when the weather is not too hot or not too cold.  Although he continues to have paranoia that people are trying to get him, he thinks that there is nothing he can do about it, and denies any concern.  He denies insomnia.  He has good appetite.  He denies feeling depressed or anxiety.  He denies AH, VH, ideas of reference.   [name], his caregiver presents to the interview.  She states that there were a few occasions he lied.  One occurred at PCP visit, stating that Chrys Racer told him that his PCP has dementia.  He also states that Chrys Racer is trying to get money from him as he is  getting thousands of dollars.  She denies any aggressive behavior.  She wants his medication to be continued at the same dose.    Daily routine:takes a walk, watches TV Support:Caroline Lovena Le, sister Household:sister (used to live at group home) Marital status:single Number of children:may have some children while he was in jail  Visit Diagnosis:    ICD-10-CM   1. Anxiety state  F41.1   2. Schizophrenia, unspecified type ([place])  F20.9    . Past Psychiatric History: Please see initial evaluation for full details. I have reviewed the history. No updates at this time.     Past Medical History:  Past Medical History:  Diagnosis Date  . Constipation   . Schizophrenia [facility])     Past Surgical History:  Procedure Laterality Date  . APPENDECTOMY    . COLONOSCOPY    . COLONOSCOPY WITH PROPOFOL N/A 08/05/2017   Procedure: COLONOSCOPY WITH PROPOFOL;  Surgeon: Lin Landsman, MD;  Location: [facility] ENDOSCOPY;  Service: Gastroenterology;  Laterality: N/A;    Family Psychiatric History: Please see initial evaluation for full details. I have reviewed the history. No updates at this time.     Family History:  Family History  Problem Relation Age of Onset  . Alcohol abuse Maternal Aunt     Social History:  Social History   Socioeconomic History  . Marital status: Single    Spouse name: Not on file  . Number of children: Not on file  .  Years of education: Not on file  . Highest education level: Not on file  Occupational History  . Not on file  Tobacco Use  . Smoking status: Never Smoker  . Smokeless tobacco: Never Used  Vaping Use  . Vaping Use: Never used  Substance and Sexual Activity  . Alcohol use: Not Currently  . Drug use: Not Currently  . Sexual activity: Never  Other Topics Concern  . Not on file  Social History Narrative  . Not on file   Social Determinants of Health   Financial Resource Strain: Not on file  Food Insecurity: Not on file   Transportation Needs: Not on file  Physical Activity: Not on file  Stress: Not on file  Social Connections: Not on file    Allergies:  Allergies  Allergen Reactions  . Prolixin [Fluphenazine] Other (See Comments)    Pt states his muscles lock up  . Haloperidol Lactate Other (See Comments)    Muscles lock up.  . Thioridazine Hcl Other (See Comments)    Induces heart attack.    Metabolic Disorder Labs: No results found for: HGBA1C, MPG No results found for: PROLACTIN No results found for: CHOL, TRIG, HDL, CHOLHDL, VLDL, LDLCALC No results found for: TSH  Therapeutic Level Labs: No results found for: LITHIUM No results found for: VALPROATE No components found for:  CBMZ  Current Medications: Current Outpatient Medications  Medication Sig Dispense Refill  . [START ON 08/07/2020] LORazepam (ATIVAN) 0.5 MG tablet Take 1 tablet (0.5 mg total) by mouth 2 (two) times daily. 60 tablet 2  . docusate sodium (COLACE) 100 MG capsule Take 100 mg by mouth 2 (two) times daily.    Marland Kitchen loratadine (CLARITIN) 10 MG tablet Take 10 mg by mouth daily.     Marland Kitchen LORazepam (ATIVAN) 0.5 MG tablet Take 1 tablet (0.5 mg total) by mouth 2 (two) times daily as needed for anxiety. 60 tablet 2  . meclizine (ANTIVERT) 25 MG tablet Take 1 tablet (25 mg total) by mouth every 6 (six) hours as needed for dizziness. (Patient not taking: Reported on 05/01/2018) 20 tablet 0  . metoCLOPramide (REGLAN) 10 MG tablet Take 1 tablet (10 mg total) by mouth every 6 (six) hours as needed (nausea). (Patient not taking: Reported on 05/01/2018) 6 tablet 0  . [START ON 07/05/2020] OLANZapine (ZYPREXA) 15 MG tablet Total of 35 mg at night. Take along with 20 mg tab 30 tablet 4  . [START ON 07/05/2020] OLANZapine (ZYPREXA) 20 MG tablet 32.5 mg at night(take 20 mg, 10 mg, 2.5 mg tab) 30 tablet 4  . polyethylene glycol (MIRALAX / GLYCOLAX) packet Take 17 g by mouth daily.     No current facility-administered medications for this visit.      Musculoskeletal: Strength & Muscle Tone: N/A Gait & Station: N/A Patient leans: N/A  Psychiatric Specialty Exam: Review of Systems  Psychiatric/Behavioral: Negative for agitation, behavioral problems, confusion, decreased concentration, dysphoric mood, hallucinations, self-injury, sleep disturbance and suicidal ideas. The patient is not nervous/anxious and is not hyperactive.   All other systems reviewed and are negative.   There were no vitals taken for this visit.There is no height or weight on file to calculate BMI.  General Appearance: Fairly Groomed  Eye Contact:  Good  Speech:  Clear and Coherent  Volume:  Normal  Mood:  good  Affect:  Appropriate, Congruent and slightly restricted  Thought Process:  Coherent  Orientation:  Full (Time, Place, and Person)  Thought Content: Logical  Suicidal Thoughts:  No  Homicidal Thoughts:  No  Memory:  Immediate;   Good  Judgement:  Good  Insight:  Present  Psychomotor Activity:  Normal  Concentration:  Concentration: Good and Attention Span: Good  Recall:  Good  Fund of Knowledge: Good  Language: Good  Akathisia:  No  Handed:  Right  AIMS (if indicated): not done  Assets:  Communication Skills Desire for Improvement  ADL's:  Intact  Cognition: WNL  Sleep:  Good   Screenings: PHQ2-9   Flowsheet Row Video Visit from 06/30/2020 in [place] Video Visit from 04/07/2020 in [place]  PHQ-2 Total Score 0 0    Flowsheet Row Video Visit from 06/30/2020 in [place] Video Visit from 04/07/2020 in [place] No Risk No Risk       Assessment and Plan:  Jahmier Willadsen is a 65 y.o. year old male with a history of schizophrenia, legally blind , who presents for follow up appointment for below.   1. Schizophrenia, unspecified type ([place]) 2. Anxiety state There has been no significant  behavior issues except occasional comment he made, which was not based on truth.  Will continue current dose of olanzapine to target schizophrenia.  Will continue lorazepam for anxiety and irritability.  Her sister contacted PCP/social worker and they have been trying to find a skilled nursing facility.   This clinician has discussed the side effect associated with medication prescribed during this encounter. Please refer to notes in the previous encounters for more details.   Plan I have reviewed and updated plans as below 1.Continueolanzapine 35 mg - worsening in paranoia on lower dose 2.Continuelorazepam 0.5 mg twice a day 3.Next appointment: 7/14 at 3 PM for 30 mins, video. His sister declined in person visit due to lack of transportation (she does not drive a highway) - labs done by PCP 10/2019 for lipid, BS  The patient demonstrates the following risk factors for suicide: Chronic risk factors for suicide include:psychiatric disorder ofschizophrenia. Acute risk factorsfor suicide include: unemployment. Protective factorsfor this patient include: positive social support. Considering these factors, the overall suicide risk at this point appears to below. Patientisappropriate for outpatient follow up.  Norman Clay, MD 06/30/2020, 11:35 AM

## 2020-06-30 ENCOUNTER — Telehealth (INDEPENDENT_AMBULATORY_CARE_PROVIDER_SITE_OTHER): Payer: Medicare Other | Admitting: Psychiatry

## 2020-06-30 ENCOUNTER — Encounter: Payer: Self-pay | Admitting: Psychiatry

## 2020-06-30 ENCOUNTER — Other Ambulatory Visit: Payer: Self-pay

## 2020-06-30 DIAGNOSIS — F411 Generalized anxiety disorder: Secondary | ICD-10-CM | POA: Diagnosis not present

## 2020-06-30 DIAGNOSIS — F209 Schizophrenia, unspecified: Secondary | ICD-10-CM

## 2020-06-30 MED ORDER — LORAZEPAM 0.5 MG PO TABS: 0.5000 mg | ORAL_TABLET | Freq: Two times a day (BID) | ORAL | 2 refills | Status: AC

## 2020-07-10 ENCOUNTER — Other Ambulatory Visit: Payer: Self-pay

## 2020-07-10 ENCOUNTER — Encounter (HOSPITAL_BASED_OUTPATIENT_CLINIC_OR_DEPARTMENT_OTHER): Payer: Self-pay | Admitting: *Deleted

## 2020-07-10 ENCOUNTER — Emergency Department (HOSPITAL_BASED_OUTPATIENT_CLINIC_OR_DEPARTMENT_OTHER)
Admission: EM | Admit: 2020-07-10 | Discharge: 2020-07-10 | Disposition: A | Discharge status: Home / Self Care | Payer: Medicare Other | Attending: Emergency Medicine | Admitting: Emergency Medicine

## 2020-07-10 ENCOUNTER — Emergency Department (HOSPITAL_BASED_OUTPATIENT_CLINIC_OR_DEPARTMENT_OTHER): Payer: Medicare Other

## 2020-07-10 DIAGNOSIS — R1030 Lower abdominal pain, unspecified: Secondary | ICD-10-CM | POA: Diagnosis present

## 2020-07-10 DIAGNOSIS — Z79899 Other long term (current) drug therapy: Secondary | ICD-10-CM | POA: Insufficient documentation

## 2020-07-10 DIAGNOSIS — I10 Essential (primary) hypertension: Secondary | ICD-10-CM | POA: Insufficient documentation

## 2020-07-10 DIAGNOSIS — N281 Cyst of kidney, acquired: Secondary | ICD-10-CM | POA: Diagnosis not present

## 2020-07-10 DIAGNOSIS — K5900 Constipation, unspecified: Secondary | ICD-10-CM | POA: Insufficient documentation

## 2020-07-10 HISTORY — DX: Essential (primary) hypertension: I10

## 2020-07-10 LAB — COMPREHENSIVE METABOLIC PANEL
ALT: 14 U/L (ref 0–44)
AST: 17 U/L (ref 15–41)
Albumin: 3.5 g/dL (ref 3.5–5.0)
Alkaline Phosphatase: 80 U/L (ref 38–126)
Anion gap: 7 (ref 5–15)
BUN: 6 mg/dL — ABNORMAL LOW (ref 8–23)
CO2: 25 mmol/L (ref 22–32)
Calcium: 8.9 mg/dL (ref 8.9–10.3)
Chloride: 107 mmol/L (ref 98–111)
Creatinine, Ser: 0.87 mg/dL (ref 0.61–1.24)
GFR, Estimated: >60 mL/min (ref >60)
Glucose, Bld: 126 mg/dL — ABNORMAL HIGH (ref 70–99)
Potassium: 3.9 mmol/L (ref 3.5–5.1)
Sodium: 139 mmol/L (ref 135–145)
Total Bilirubin: 1 mg/dL (ref 0.3–1.2)
Total Protein: 7.4 g/dL (ref 6.5–8.1)

## 2020-07-10 LAB — CBC
HCT: 43.7 % (ref 39.0–52.0)
Hemoglobin: 14.9 g/dL (ref 13.0–17.0)
MCH: 31.6 pg (ref 26.0–34.0)
MCHC: 34.1 g/dL (ref 30.0–36.0)
MCV: 92.6 fL (ref 80.0–100.0)
Platelets: 211 10*3/uL (ref 150–400)
RBC: 4.72 MIL/uL (ref 4.22–5.81)
RDW: 13.9 % (ref 11.5–15.5)
WBC: 6.7 10*3/uL (ref 4.0–10.5)
nRBC: 0 % (ref 0.0–0.2)

## 2020-07-10 LAB — LIPASE, BLOOD: Lipase: 43 U/L (ref 11–51)

## 2020-07-10 MED ORDER — FLEET ENEMA 7-19 GM/118ML RE ENEM
1.0000 | ENEMA | Freq: Once | RECTAL | Status: AC
  Administered 2020-07-10: 1 via RECTAL
  Filled 2020-07-10: qty 1

## 2020-07-10 NOTE — ED Notes (Signed)
Pt produced moderate result with enema, soft brown stool.  Pt reports feeling better after movement.  No visible blood.

## 2020-07-10 NOTE — Discharge Instructions (Addendum)
It is important that you are staying well-hydrated with water. Eat a high-fiber diet. Continue taking stool softeners as prescribed. Start taking the MiraLAX-like medication daily until you are having regular bowel movements.  Then continue to use as needed. Follow-up with your primary care doctor as needed for further evaluation. Return to the emergency room with any new, worsening, concerning symptoms.

## 2020-07-10 NOTE — ED Provider Notes (Signed)
[place] EMERGENCY DEPARTMENT Provider Note   CSN: [id number] Arrival date & time: 07/10/20  1342     History Chief Complaint  Patient presents with  . Constipation    Nicholas Moyer is a 65 y.o. male presenting for evaluation of constipation.  Patient states his last bowel movement was Tuesday, 5 days ago.  He does have a history of constipation, but normally not this long.  He states that when he tries have a bowel movement, nothing passes.  He is still passing gas.  He reports pain in his lower abdomen when he bends forward, but no pain other times.  No nausea or vomiting.  No fevers or chills.  He has been taking the stool softeners that he takes daily as prescribed, recently started MiraLAX, had a dose yesterday and a dose today.  No improvement of constipation.  Caregiver, patient's sister, reports patient has had a decreased appetite.  Patient denies pain, nausea, vomiting after eating.  No recent medication changes.  He has previous history of appendectomy, no other abdominal surgeries.  HPI     Past Medical History:  Diagnosis Date  . Constipation   . Hypertension   . Schizophrenia [facility])     Patient Active Problem List   Diagnosis Date Noted  . Colon cancer screening   . Schizophrenia ([place]) 04/03/2017  . CONSTIPATION, CHRONIC 11/09/2008  . HEMATOCHEZIA 11/09/2008    Past Surgical History:  Procedure Laterality Date  . APPENDECTOMY    . COLONOSCOPY    . COLONOSCOPY WITH PROPOFOL N/A 08/05/2017   Procedure: COLONOSCOPY WITH PROPOFOL;  Surgeon: Lin Landsman, MD;  Location: [facility] ENDOSCOPY;  Service: Gastroenterology;  Laterality: N/A;       Family History  Problem Relation Age of Onset  . Alcohol abuse Maternal Aunt     Social History   Tobacco Use  . Smoking status: Never Smoker  . Smokeless tobacco: Never Used  Vaping Use  . Vaping Use: Never used  Substance Use Topics  . Alcohol use: Not Currently  . Drug use: Not Currently     Home Medications Prior to Admission medications   Medication Sig Start Date End Date Taking? Authorizing Provider  docusate sodium (COLACE) 100 MG capsule Take 100 mg by mouth 2 (two) times daily.    [provider]  loratadine (CLARITIN) 10 MG tablet Take 10 mg by mouth daily.     [provider]  LORazepam (ATIVAN) 0.5 MG tablet Take 1 tablet (0.5 mg total) by mouth 2 (two) times daily as needed for anxiety. 05/02/20   Norman Clay, MD  LORazepam (ATIVAN) 0.5 MG tablet Take 1 tablet (0.5 mg total) by mouth 2 (two) times daily. 08/07/20 11/05/20  Norman Clay, MD  meclizine (ANTIVERT) 25 MG tablet Take 1 tablet (25 mg total) by mouth every 6 (six) hours as needed for dizziness. Patient not taking: Reported on 05/01/2018 04/04/12   Riki Altes, MD  metoCLOPramide (REGLAN) 10 MG tablet Take 1 tablet (10 mg total) by mouth every 6 (six) hours as needed (nausea). Patient not taking: Reported on 05/01/2018 04/04/12   Riki Altes, MD  OLANZapine (ZYPREXA) 15 MG tablet Total of 35 mg at night. Take along with 20 mg tab 07/05/20   Norman Clay, MD  OLANZapine (ZYPREXA) 20 MG tablet 32.5 mg at night(take 20 mg, 10 mg, 2.5 mg tab) 07/05/20   Norman Clay, MD  polyethylene glycol (MIRALAX / GLYCOLAX) packet Take 17 g by mouth daily.  [provider]    Allergies    Prolixin [fluphenazine], Haloperidol lactate, and Thioridazine hcl  Review of Systems   Review of Systems  Gastrointestinal: Positive for constipation.  All other systems reviewed and are negative.   Physical Exam Updated Vital Signs BP 136/74   Pulse 74   Temp 98.3 F (36.8 C) (Oral)   Resp 16   Wt 75 kg   SpO2 99%   BMI 27.52 kg/m   Physical Exam Vitals and nursing note reviewed.  Constitutional:      General: He is not in acute distress.    Appearance: He is well-developed.  HENT:     Head: Normocephalic and atraumatic.  Eyes:     Conjunctiva/sclera: Conjunctivae normal.     Pupils:  Pupils are equal, round, and reactive to light.  Cardiovascular:     Rate and Rhythm: Normal rate and regular rhythm.     Pulses: Normal pulses.  Pulmonary:     Effort: Pulmonary effort is normal. No respiratory distress.     Breath sounds: Normal breath sounds. No wheezing.  Abdominal:     General: There is distension.     Palpations: Abdomen is soft. There is no mass.     Tenderness: There is no abdominal tenderness. There is no guarding or rebound.     Comments: Mildly distended/full, however not rigid.  No tenderness.  No mass.  Musculoskeletal:        General: Normal range of motion.     Cervical back: Normal range of motion and neck supple.  Skin:    General: Skin is warm and dry.     Capillary Refill: Capillary refill takes less than 2 seconds.  Neurological:     Mental Status: He is alert and oriented to person, place, and time.     ED Results / Procedures / Treatments   Labs (all labs ordered are listed, but only abnormal results are displayed) Labs Reviewed  COMPREHENSIVE METABOLIC PANEL - Abnormal; Notable for the following components:      Result Value   Glucose, Bld 126 (*)    BUN 6 (*)    All other components within normal limits  LIPASE, BLOOD  CBC  URINALYSIS, ROUTINE W REFLEX MICROSCOPIC    EKG None  Radiology CT ABDOMEN PELVIS WO CONTRAST  Result Date: 07/10/2020 CLINICAL DATA:  No BM since Tuesday. Decreased appetite. Abdominal distention. EXAM: CT ABDOMEN AND PELVIS WITHOUT CONTRAST TECHNIQUE: Multidetector CT imaging of the abdomen and pelvis was performed following the standard protocol without IV contrast. COMPARISON:  None. FINDINGS: Lower chest: No acute abnormality. Hepatobiliary: Scattered subcentimeter hypodensities in the liver, likely cysts. Gallbladder unremarkable. Pancreas: No focal abnormality or ductal dilatation. Spleen: No focal abnormality.  Normal size. Adrenals/Urinary Tract: 4 cm low-density lesion in the midpole of the left kidney.  This cannot be fully characterized on this noncontrast study. No hydronephrosis or stones. Urinary bladder and adrenal glands unremarkable. Stomach/Bowel: Moderate stool throughout the colon. Small bowel and stomach decompressed, grossly unremarkable. No evidence of bowel obstruction. Vascular/Lymphatic: No evidence of aneurysm or adenopathy. Reproductive: Mildly prominent prostate. Other: No free fluid or free air. Musculoskeletal: No acute bony abnormality. IMPRESSION: 4 cm low-density lesion within the midpole left kidney cannot be fully characterized on this noncontrast study. This could be further evaluated with contrast-enhanced CT/MRI or ultrasound if felt clinically indicated. Scattered hypodensities in the liver also cannot be fully characterized on this noncontrast study but statistically most likely reflects cysts. No acute  findings in the abdomen or pelvis. Electronically Signed   By: Rolm Baptise M.D.   On: 07/10/2020 14:51    Procedures Procedures   Medications Ordered in ED Medications  sodium phosphate (FLEET) 7-19 GM/118ML enema 1 enema (1 enema Rectal Given 07/10/20 1627)    ED Course  I have reviewed the triage vital signs and the nursing notes.  Pertinent labs & imaging results that were available during my care of the patient were reviewed by me and considered in my medical decision making (see chart for details).    MDM Rules/Calculators/A&P                          Patient presenting for evaluation of constipation.  On exam, patient appears nontoxic.  His abdomen is slightly distended and firm, however not rigid.  No abdominal tenderness.  No nausea or vomiting.  Low suspicion for obstruction, however in the setting of constipation for many days, will obtain labs and CT to rule out obstruction.  Labs interpreted by me, overall reassuring.  CT abdomen pelvis negative for acute findings including bowel obstruction.  Does show moderate to large stool burden.  Additionally,  there is a renal cyst noted on the CT, but I discussed with patient and caregiver and importance of follow-up with primary care.  Discussed findings with patient and caregiver.  Will give enema.  Patient had good bowel output after enema, states he is feeling better.  I discussed treatment for constipation and follow-up with primary care.  At this time, patient appears safe for discharge.  Return precautions given.  Patient states he understands and agrees to plan.  Final Clinical Impression(s) / ED Diagnoses Final diagnoses:  Constipation, unspecified constipation type    Rx / DC Orders ED Discharge Orders    None       Franchot Heidelberg, PA-C 07/10/20 1739    Hayden Rasmussen, MD 07/10/20 1753

## 2020-07-10 NOTE — ED Notes (Signed)
Fleet enema given without resistance. Pt aware to hold enema as long as he can. BSC close at bedside.

## 2020-07-10 NOTE — ED Triage Notes (Signed)
Caregiver stated pt has not had a BM since Tuesday, decreased appetite, abd distended in appearance. No N/V

## 2020-07-18 ENCOUNTER — Ambulatory Visit: Payer: Medicare Other | Admitting: Psychiatry

## 2020-07-22 ENCOUNTER — Telehealth: Payer: Self-pay

## 2020-07-22 ENCOUNTER — Other Ambulatory Visit: Payer: Self-pay | Admitting: Psychiatry

## 2020-07-22 MED ORDER — OLANZAPINE 2.5 MG PO TABS: ORAL_TABLET | ORAL | 0 refills | Status: DC

## 2020-07-22 NOTE — Telephone Encounter (Signed)
Talked with Hoyle Sauer.  Levander started to say that something is crawling on his skin since last Tues. Although he did not take a shower last Tues, he took the following days. No other behavioral concerns or agitation. The following was discussed.  - Add olanzapine 2.5 mg at night (total of 37.5 mg at night) - Next appointment: 6/27 at 3:30, video visit - Hoyle Sauer was advised to contact the clinic if he experiences any side effect/worsening in his condition

## 2020-07-22 NOTE — Telephone Encounter (Signed)
she states she like to speak with you about patient she states that he feels like something iis crawling on him she thinks his mediation needs to be adjusted.

## 2020-08-01 ENCOUNTER — Telehealth: Payer: Medicare Other | Admitting: Psychiatry

## 2020-08-10 NOTE — Progress Notes (Addendum)
Virtual Visit via Video Note  I connected with Nicholas Moyer on 08/15/20 at  3:30 PM EDT by a video enabled telemedicine application and verified that I am speaking with the correct person using two identifiers.  Location: Patient: home Provider: office Persons participated in the visit- patient, provider    I discussed the limitations of evaluation and management by telemedicine and the availability of in person appointments. The patient expressed understanding and agreed to proceed.    I discussed the assessment and treatment plan with the patient. The patient was provided an opportunity to ask questions and all were answered. The patient agreed with the plan and demonstrated an understanding of the instructions.   The patient was advised to call back or seek an in-person evaluation if the symptoms worsen or if the condition fails to improve as anticipated.  I provided 13 minutes of non-face-to-face time during this encounter.   Nicholas Clay, MD    [facility] MD/PA/NP OP Progress Note  08/15/2020 4:06 PM Nicholas Moyer  MRN:  [id number]  Chief Complaint:  Chief Complaint   Follow-up; Other    HPI:  This is a follow-up appointment for schizophrenia and anxiety.   He states that he thinks tactile hallucinations has been better. He enjoys listening to TV. He states that "its alright" when he was asked about writing music/song.  Although he reports vague paranoia of somebody is telling that something is wrong, he denies any concern.  He denies AH, VH.  Although he feels down at times, he usually feels better after listening to music.  He denies anxiety.  He sleeps well.  He denies SI.  He denies ideas of reference.   Nicholas Moyer presents to the interview.  He has been doing well since up titration of olanzapine.  It took about 1.5 weeks for hallucinations to completely go away. He did not have any issues with recent PCP visit as he did before.  No known aggression or safety concern.  She  feels comfortable to stay on the medication of the days.   Daily routine: takes a walk, watches TV Support: Nicholas Moyer, sister Household: sister (used to live at group home) Marital status: single Number of children: may have some children while he was in jail  161 lbs Wt Readings from Last 3 Encounters:  07/10/20 165 lb 6.4 oz (75 kg)  05/01/18 148 lb (67.1 kg)  01/07/18 142 lb (64.4 kg)     Visit Diagnosis:    ICD-10-CM   1. Schizophrenia, unspecified type ([place])  F20.9     2. Anxiety state  F41.1       Past Psychiatric History: Please see initial evaluation for full details. I have reviewed the history. No updates at this time.     Past Medical History:  Past Medical History:  Diagnosis Date   Constipation    Hypertension    Schizophrenia ([place])     Past Surgical History:  Procedure Laterality Date   APPENDECTOMY     COLONOSCOPY     COLONOSCOPY WITH PROPOFOL N/A 08/05/2017   Procedure: COLONOSCOPY WITH PROPOFOL;  Surgeon: Lin Landsman, MD;  Location: [facility] ENDOSCOPY;  Service: Gastroenterology;  Laterality: N/A;    Family Psychiatric History: Please see initial evaluation for full details. I have reviewed the history. No updates at this time.     Family History:  Family History  Problem Relation Age of Onset   Alcohol abuse Maternal Aunt     Social History:  Social History  Socioeconomic History   Marital status: Single    Spouse name: Not on file   Number of children: Not on file   Years of education: Not on file   Highest education level: Not on file  Occupational History   Not on file  Tobacco Use   Smoking status: Never   Smokeless tobacco: Never  Vaping Use   Vaping Use: Never used  Substance and Sexual Activity   Alcohol use: Not Currently   Drug use: Not Currently   Sexual activity: Never  Other Topics Concern   Not on file  Social History Narrative   Not on file   Social Determinants of Health   Financial Resource  Strain: Not on file  Food Insecurity: Not on file  Transportation Needs: Not on file  Physical Activity: Not on file  Stress: Not on file  Social Connections: Not on file    Allergies:  Allergies  Allergen Reactions   Prolixin [Fluphenazine] Other (See Comments)    Pt states his muscles lock up   Haloperidol Lactate Other (See Comments)    Muscles lock up.   Thioridazine Hcl Other (See Comments)    Induces heart attack.    Metabolic Disorder Labs: No results found for: HGBA1C, MPG No results found for: PROLACTIN No results found for: CHOL, TRIG, HDL, CHOLHDL, VLDL, LDLCALC No results found for: TSH  Therapeutic Level Labs: No results found for: LITHIUM No results found for: VALPROATE No components found for:  CBMZ  Current Medications: Current Outpatient Medications  Medication Sig Dispense Refill   amLODipine (NORVASC) 10 MG tablet Take 10 mg by mouth daily.     pravastatin (PRAVACHOL) 20 MG tablet Take 20 mg by mouth daily.     docusate sodium (COLACE) 100 MG capsule Take 100 mg by mouth 2 (two) times daily.     loratadine (CLARITIN) 10 MG tablet Take 10 mg by mouth daily.      LORazepam (ATIVAN) 0.5 MG tablet Take 1 tablet (0.5 mg total) by mouth 2 (two) times daily as needed for anxiety. 60 tablet 2   LORazepam (ATIVAN) 0.5 MG tablet Take 1 tablet (0.5 mg total) by mouth 2 (two) times daily. 60 tablet 2   meclizine (ANTIVERT) 25 MG tablet Take 1 tablet (25 mg total) by mouth every 6 (six) hours as needed for dizziness. (Patient not taking: Reported on 05/01/2018) 20 tablet 0   metoCLOPramide (REGLAN) 10 MG tablet Take 1 tablet (10 mg total) by mouth every 6 (six) hours as needed (nausea). (Patient not taking: Reported on 05/01/2018) 6 tablet 0   OLANZapine (ZYPREXA) 15 MG tablet Total of 35 mg at night. Take along with 20 mg tab 30 tablet 4   [START ON 08/21/2020] OLANZapine (ZYPREXA) 2.5 MG tablet Total of 37.5 mg at night. Take along with 20 mg and 15 mg tab 30 tablet 2    OLANZapine (ZYPREXA) 20 MG tablet 32.5 mg at night(take 20 mg, 10 mg, 2.5 mg tab) 30 tablet 4   polyethylene glycol (MIRALAX / GLYCOLAX) packet Take 17 g by mouth daily.     No current facility-administered medications for this visit.     Musculoskeletal: Strength & Muscle Tone:  N/A Gait & Station:  N/A Patient leans: N/A  Psychiatric Specialty Exam: Review of Systems  Psychiatric/Behavioral:  Positive for dysphoric mood. Negative for agitation, behavioral problems, confusion, decreased concentration, hallucinations, self-injury, sleep disturbance and suicidal ideas. The patient is not nervous/anxious and is not hyperactive.   All  other systems reviewed and are negative.  There were no vitals taken for this visit.There is no height or weight on file to calculate BMI.  General Appearance: Fairly Groomed  Eye Contact:  Good  Speech:  Clear and Coherent  Volume:  Normal  Mood:   good  Affect:  Appropriate, Congruent, and euthymic, slightly restricted  Thought Process:  Coherent  Orientation:  Full (Time, Place, and Person)  Thought Content: Logical and Paranoid Ideation   Suicidal Thoughts:  No  Homicidal Thoughts:  No  Memory:  Immediate;   Good  Judgement:  Good  Insight:  Present  Psychomotor Activity:  Normal  Concentration:  Concentration: Good and Attention Span: Good  Recall:  Good  Fund of Knowledge: Good  Language: Good  Akathisia:  No  Handed:  Right  AIMS (if indicated): not done  Assets:  Communication Skills Desire for Improvement  ADL's:  Intact  Cognition: WNL  Sleep:  Good   Screenings: PHQ2-9    Flowsheet Row Video Visit from 08/15/2020 in [place] Video Visit from 06/30/2020 in [place] Video Visit from 04/07/2020 in [place]  PHQ-2 Total Score 1 0 0      Flowsheet Row Video Visit from 08/15/2020 in [place] ED from 07/10/2020  in [place] Video Visit from 06/30/2020 in [place] No Risk No Risk No Risk        Assessment and Plan:  Nicholas Moyer is a 65 y.o. year old male with a history of schizophrenia, legally blind, hypertension, hyperlipidemia, who presents for follow up appointment for below.   1. Schizophrenia, unspecified type ([place]) 2. Anxiety state There has been improvement in tactile hallucinations/no known irritability or behavioral issues since up titration of olanzapine.  Will continue current dose of olanzapine to target schizophrenia.  Will continue lorazepam for anxiety and irritability.   This clinician has discussed the side effect associated with medication prescribed during this encounter. Please refer to notes in the previous encounters for more details.     Plan I have reviewed and updated plans as below  1. Continue olanzapine 37.5 mg - worsening in paranoia on lower dose 2. Continue lorazepam 0.5 mg twice a day  3. Next appointment: 9/13 at 3 PM for 30 mins, video. His sister declined in person visit due to lack of transportation (she does not drive a highway) - He regularly sees his PCP  I have utilized the [place] Controlled Substances Reporting System (PMP AWARxE) to confirm adherence regarding the patient's medication. My review reveals appropriate prescription fills.     The patient demonstrates the following risk factors for suicide: Chronic risk factors for suicide include: psychiatric disorder of schizophrenia. Acute risk factors for suicide include: unemployment. Protective factors for this patient include: positive social support. Considering these factors, the overall suicide risk at this point appears to be low. Patient is appropriate for outpatient follow up.      Nicholas Clay, MD 08/15/2020, 4:06 PM

## 2020-08-15 ENCOUNTER — Encounter: Payer: Self-pay | Admitting: Psychiatry

## 2020-08-15 ENCOUNTER — Other Ambulatory Visit: Payer: Self-pay

## 2020-08-15 ENCOUNTER — Telehealth (INDEPENDENT_AMBULATORY_CARE_PROVIDER_SITE_OTHER): Payer: Medicare Other | Admitting: Psychiatry

## 2020-08-15 DIAGNOSIS — F411 Generalized anxiety disorder: Secondary | ICD-10-CM

## 2020-08-15 DIAGNOSIS — F209 Schizophrenia, unspecified: Secondary | ICD-10-CM

## 2020-08-15 MED ORDER — OLANZAPINE 2.5 MG PO TABS: ORAL_TABLET | ORAL | 2 refills | Status: AC

## 2020-08-15 NOTE — Patient Instructions (Signed)
1. Continue olanzapine 37.5 mg  2. Continue lorazepam 0.5 mg twice a day  3. Next appointment: 9/13 at 3 PM

## 2020-08-29 NOTE — Progress Notes (Signed)
Virtual Visit via Video Note  I connected with Nicholas Moyer on 09/01/20 at  3:00 PM EDT by a video enabled telemedicine application and verified that I am speaking with the correct person using two identifiers.  Location: Patient: home Provider: office Persons participated in the visit- patient, provider    I discussed the limitations of evaluation and management by telemedicine and the availability of in person appointments. The patient expressed understanding and agreed to proceed.   I discussed the assessment and treatment plan with the patient. The patient was provided an opportunity to ask questions and all were answered. The patient agreed with the plan and demonstrated an understanding of the instructions.   The patient was advised to call back or seek an in-person evaluation if the symptoms worsen or if the condition fails to improve as anticipated.  I provided 10 minutes of non-face-to-face time during this encounter.   Norman Clay, MD    [facility] MD/PA/NP OP Progress Note  09/01/2020 3:20 PM Nicholas Moyer  MRN:  Moyer  Chief Complaint:  Chief Complaint   Follow-up; Schizophrenia    HPI:  This is a follow-up appointment for schizophrenia.  He states that he has been doing well.  He enjoys writing songs, and listens to the radio.  He sleeps well and eats well.  He denies AH, VH, or tactile hallucination.  Although he feels that people might be trying to get him, he attributes this to limited vision.  He feels comfortable to stay at the current place.  He denies feeling depressed or anxiety, irritability.  He feels comfortable to stay on his medication.   Chrys Racer presents to the interview.  There has been no change since the last visit.  She feels comfortable to stay on the current medication at this time.   Daily routine: takes a walk, watches TV, listen to video Support: Leslie Andrea, sister Household: sister (used to live at group home) Marital status:  single Number of children: may have some children while he was in jail  Visit Diagnosis:    ICD-10-CM   1. Schizophrenia, unspecified type ([place])  F20.9       Past Psychiatric History: Please see initial evaluation for full details. I have reviewed the history. No updates at this time.     Past Medical History:  Past Medical History:  Diagnosis Date   Constipation    Hypertension    Schizophrenia ([place])     Past Surgical History:  Procedure Laterality Date   APPENDECTOMY     COLONOSCOPY     COLONOSCOPY WITH PROPOFOL N/A 08/05/2017   Procedure: COLONOSCOPY WITH PROPOFOL;  Surgeon: Lin Landsman, MD;  Location: [facility] ENDOSCOPY;  Service: Gastroenterology;  Laterality: N/A;    Family Psychiatric History: Please see initial evaluation for full details. I have reviewed the history. No updates at this time.     Family History:  Family History  Problem Relation Age of Onset   Alcohol abuse Maternal Aunt     Social History:  Social History   Socioeconomic History   Marital status: Single    Spouse name: Not on file   Number of children: Not on file   Years of education: Not on file   Highest education level: Not on file  Occupational History   Not on file  Tobacco Use   Smoking status: Never   Smokeless tobacco: Never  Vaping Use   Vaping Use: Never used  Substance and Sexual Activity   Alcohol use: Not  Currently   Drug use: Not Currently   Sexual activity: Never  Other Topics Concern   Not on file  Social History Narrative   Not on file   Social Determinants of Health   Financial Resource Strain: Not on file  Food Insecurity: Not on file  Transportation Needs: Not on file  Physical Activity: Not on file  Stress: Not on file  Social Connections: Not on file    Allergies:  Allergies  Allergen Reactions   Prolixin [Fluphenazine] Other (See Comments)    Pt states his muscles lock up   Haloperidol Lactate Other (See Comments)    Muscles lock up.    Thioridazine Hcl Other (See Comments)    Induces heart attack.    Metabolic Disorder Labs: No results found for: HGBA1C, MPG No results found for: PROLACTIN No results found for: CHOL, TRIG, HDL, CHOLHDL, VLDL, LDLCALC No results found for: TSH  Therapeutic Level Labs: No results found for: LITHIUM No results found for: VALPROATE No components found for:  CBMZ  Current Medications: Current Outpatient Medications  Medication Sig Dispense Refill   amLODipine (NORVASC) 10 MG tablet Take 10 mg by mouth daily.     docusate sodium (COLACE) 100 MG capsule Take 100 mg by mouth 2 (two) times daily.     loratadine (CLARITIN) 10 MG tablet Take 10 mg by mouth daily.      LORazepam (ATIVAN) 0.5 MG tablet Take 1 tablet (0.5 mg total) by mouth 2 (two) times daily as needed for anxiety. 60 tablet 2   LORazepam (ATIVAN) 0.5 MG tablet Take 1 tablet (0.5 mg total) by mouth 2 (two) times daily. 60 tablet 2   meclizine (ANTIVERT) 25 MG tablet Take 1 tablet (25 mg total) by mouth every 6 (six) hours as needed for dizziness. (Patient not taking: Reported on 05/01/2018) 20 tablet 0   metoCLOPramide (REGLAN) 10 MG tablet Take 1 tablet (10 mg total) by mouth every 6 (six) hours as needed (nausea). (Patient not taking: Reported on 05/01/2018) 6 tablet 0   OLANZapine (ZYPREXA) 15 MG tablet Total of 35 mg at night. Take along with 20 mg tab 30 tablet 4   OLANZapine (ZYPREXA) 2.5 MG tablet Total of 37.5 mg at night. Take along with 20 mg and 15 mg tab 30 tablet 2   OLANZapine (ZYPREXA) 20 MG tablet 32.5 mg at night(take 20 mg, 10 mg, 2.5 mg tab) 30 tablet 4   polyethylene glycol (MIRALAX / GLYCOLAX) packet Take 17 g by mouth daily.     pravastatin (PRAVACHOL) 20 MG tablet Take 20 mg by mouth daily.     No current facility-administered medications for this visit.     Musculoskeletal: Strength & Muscle Tone:  N/A Gait & Station:  N/A Patient leans: N/A  Psychiatric Specialty Exam: Review of Systems   Psychiatric/Behavioral: Negative.     There were no vitals taken for this visit.There is no height or weight on file to calculate BMI.  General Appearance: Fairly Groomed  Eye Contact:  Good  Speech:  Clear and Coherent  Volume:  Normal  Mood:   good  Affect:  Appropriate, Congruent, and euthymic  Thought Process:  Coherent  Orientation:  Full (Time, Place, and Person)  Thought Content: Logical , mild paranoia  Suicidal Thoughts:  No  Homicidal Thoughts:  No  Memory:  Immediate;   Good  Judgement:  Good  Insight:  Present  Psychomotor Activity:  Normal  Concentration:  Concentration: Good and Attention Span: Good  Recall:  Good  Fund of Knowledge: Good  Language: Good  Akathisia:  No  Handed:  Right  AIMS (if indicated): not done  Assets:  Communication Skills Desire for Improvement  ADL's:  Intact  Cognition: WNL  Sleep:  Good   Screenings: PHQ2-9    Flowsheet Row Video Visit from 08/15/2020 in [facility] Video Visit from 06/30/2020 in [place] Video Visit from 04/07/2020 in [facility]  PHQ-2 Total Score 1 0 0      Flowsheet Row Video Visit from 08/15/2020 in [facility] from 07/10/2020 in [facility] Video Visit from 06/30/2020 in [place] No Risk No Risk No Risk        Assessment and Plan:  Boen Sterbenz is a 65 y.o. year old male with a history of ,schizophrenia, legally blind, hypertension, hyperlipidemia who presents for follow up appointment for below.    1. Schizophrenia, unspecified type ([place]) He denies any psychotic symptoms except mild paranoia, and no behavioral issues since up titration of olanzapine.  Will continue current dose of olanzapine to target schizophrenia.  Will continue lorazepam for anxiety and irritability.   This clinician has discussed  the side effect associated with medication prescribed during this encounter. Please refer to notes in the previous encounters for more details.      Plan I have reviewed and updated plans as below  1. Continue olanzapine 37.5 mg - worsening in paranoia on lower dose 2. Continue lorazepam 0.5 mg twice a day  3. Next appointment: 9/13 at 3:30 PM for 30 mins, video. His sister declined in person visit due to lack of transportation (she does not drive a highway) - He regularly sees his PCP   I have utilized the [place] Controlled Substances Reporting System (PMP AWARxE) to confirm adherence regarding the patient's medication. My review reveals appropriate prescription fills.     The patient demonstrates the following risk factors for suicide: Chronic risk factors for suicide include: psychiatric disorder of schizophrenia. Acute risk factors for suicide include: unemployment. Protective factors for this patient include: positive social support. Considering these factors, the overall suicide risk at this point appears to be low. Patient is appropriate for outpatient follow up.         Norman Clay, MD 09/01/2020, 3:20 PM

## 2020-09-01 ENCOUNTER — Encounter: Payer: Self-pay | Admitting: Psychiatry

## 2020-09-01 ENCOUNTER — Telehealth (INDEPENDENT_AMBULATORY_CARE_PROVIDER_SITE_OTHER): Payer: Medicare Other | Admitting: Psychiatry

## 2020-09-01 ENCOUNTER — Other Ambulatory Visit: Payer: Self-pay

## 2020-09-01 DIAGNOSIS — F209 Schizophrenia, unspecified: Secondary | ICD-10-CM

## 2020-10-31 NOTE — Progress Notes (Deleted)
[facility] MD/PA/NP OP Progress Note  10/31/2020 12:36 PM Nicholas Moyer  MRN:  [id number]  Chief Complaint:  HPI: *** Visit Diagnosis: No diagnosis found.  Past Psychiatric History: Please see initial evaluation for full details. I have reviewed the history. No updates at this time.     Past Medical History:  Past Medical History:  Diagnosis Date   Constipation    Hypertension    Schizophrenia ([place])     Past Surgical History:  Procedure Laterality Date   APPENDECTOMY     COLONOSCOPY     COLONOSCOPY WITH PROPOFOL N/A 08/05/2017   Procedure: COLONOSCOPY WITH PROPOFOL;  Surgeon: Lin Landsman, MD;  Location: [facility] ENDOSCOPY;  Service: Gastroenterology;  Laterality: N/A;    Family Psychiatric History: Please see initial evaluation for full details. I have reviewed the history. No updates at this time.     Family History:  Family History  Problem Relation Age of Onset   Alcohol abuse Maternal Aunt     Social History:  Social History   Socioeconomic History   Marital status: Single    Spouse name: Not on file   Number of children: Not on file   Years of education: Not on file   Highest education level: Not on file  Occupational History   Not on file  Tobacco Use   Smoking status: Never   Smokeless tobacco: Never  Vaping Use   Vaping Use: Never used  Substance and Sexual Activity   Alcohol use: Not Currently   Drug use: Not Currently   Sexual activity: Never  Other Topics Concern   Not on file  Social History Narrative   Not on file   Social Determinants of Health   Financial Resource Strain: Not on file  Food Insecurity: Not on file  Transportation Needs: Not on file  Physical Activity: Not on file  Stress: Not on file  Social Connections: Not on file    Allergies:  Allergies  Allergen Reactions   Prolixin [Fluphenazine] Other (See Comments)    Pt states his muscles lock up   Haloperidol Lactate Other (See Comments)    Muscles lock up.    Thioridazine Hcl Other (See Comments)    Induces heart attack.    Metabolic Disorder Labs: No results found for: HGBA1C, MPG No results found for: PROLACTIN No results found for: CHOL, TRIG, HDL, CHOLHDL, VLDL, LDLCALC No results found for: TSH  Therapeutic Level Labs: No results found for: LITHIUM No results found for: VALPROATE No components found for:  CBMZ  Current Medications: Current Outpatient Medications  Medication Sig Dispense Refill   amLODipine (NORVASC) 10 MG tablet Take 10 mg by mouth daily.     docusate sodium (COLACE) 100 MG capsule Take 100 mg by mouth 2 (two) times daily.     loratadine (CLARITIN) 10 MG tablet Take 10 mg by mouth daily.      LORazepam (ATIVAN) 0.5 MG tablet Take 1 tablet (0.5 mg total) by mouth 2 (two) times daily as needed for anxiety. 60 tablet 2   LORazepam (ATIVAN) 0.5 MG tablet Take 1 tablet (0.5 mg total) by mouth 2 (two) times daily. 60 tablet 2   meclizine (ANTIVERT) 25 MG tablet Take 1 tablet (25 mg total) by mouth every 6 (six) hours as needed for dizziness. (Patient not taking: Reported on 05/01/2018) 20 tablet 0   metoCLOPramide (REGLAN) 10 MG tablet Take 1 tablet (10 mg total) by mouth every 6 (six) hours as needed (nausea). (Patient not taking:  Reported on 05/01/2018) 6 tablet 0   OLANZapine (ZYPREXA) 15 MG tablet Total of 35 mg at night. Take along with 20 mg tab 30 tablet 4   OLANZapine (ZYPREXA) 2.5 MG tablet Total of 37.5 mg at night. Take along with 20 mg and 15 mg tab 30 tablet 2   OLANZapine (ZYPREXA) 20 MG tablet 32.5 mg at night(take 20 mg, 10 mg, 2.5 mg tab) 30 tablet 4   polyethylene glycol (MIRALAX / GLYCOLAX) packet Take 17 g by mouth daily.     pravastatin (PRAVACHOL) 20 MG tablet Take 20 mg by mouth daily.     No current facility-administered medications for this visit.     Musculoskeletal: Strength & Muscle Tone:  N/A Gait & Station:  N/A Patient leans: N/A  Psychiatric Specialty Exam: Review of Systems  There  were no vitals taken for this visit.There is no height or weight on file to calculate BMI.  General Appearance: {Appearance:22683}  Eye Contact:  {BHH EYE CONTACT:22684}  Speech:  Clear and Coherent  Volume:  Normal  Mood:  {BHH MOOD:22306}  Affect:  {Affect (PAA):22687}  Thought Process:  Coherent  Orientation:  Full (Time, Place, and Person)  Thought Content: Logical   Suicidal Thoughts:  {ST/HT (PAA):22692}  Homicidal Thoughts:  {ST/HT (PAA):22692}  Memory:  Immediate;   Good  Judgement:  {Judgement (PAA):22694}  Insight:  {Insight (PAA):22695}  Psychomotor Activity:  Normal  Concentration:  Concentration: Good and Attention Span: Good  Recall:  Good  Fund of Knowledge: Good  Language: Good  Akathisia:  No  Handed:  Right  AIMS (if indicated): not done  Assets:  Communication Skills Desire for Improvement  ADL's:  Intact  Cognition: WNL  Sleep:  {BHH GOOD/FAIR/POOR:22877}   Screenings: PHQ2-9    Flowsheet Row Video Visit from 08/15/2020 in [facility] Video Visit from 06/30/2020 in [facility] Video Visit from 04/07/2020 in [facility]  PHQ-2 Total Score 1 0 0      Flowsheet Row Video Visit from 08/15/2020 in [facility] from 07/10/2020 in [place] Video Visit from 06/30/2020 in [place] No Risk No Risk No Risk        Assessment and Plan:   Nicholas Moyer is a 65 y.o. year old male with a history of schizophrenia, legally blind, hypertension, hyperlipidemia , who presents for follow up appointment for below.      1. Schizophrenia, unspecified type ([place]) He denies any psychotic symptoms except mild paranoia, and no behavioral issues since up titration of olanzapine.  Will continue current dose of olanzapine to target schizophrenia.  Will continue lorazepam for anxiety  and irritability.    This clinician has discussed the side effect associated with medication prescribed during this encounter. Please refer to notes in the previous encounters for more details.      Plan  1. Continue olanzapine 37.5 mg - worsening in paranoia on lower dose 2. Continue lorazepam 0.5 mg twice a day  3. Next appointment: 9/13 at 3:30 PM for 30 mins, video. His sister declined in person visit due to lack of transportation (she does not drive a highway) - He regularly sees his PCP   The patient demonstrates the following risk factors for suicide: Chronic risk factors for suicide include: psychiatric disorder of schizophrenia. Acute risk factors for suicide include: unemployment. Protective factors for this patient include: positive social support. Considering these factors,  the overall suicide risk at this point appears to be low. Patient is appropriate for outpatient follow up.            Norman Clay, MD 10/31/2020, 12:36 PM

## 2020-11-01 ENCOUNTER — Telehealth: Payer: Medicare Other | Admitting: Psychiatry

## 2021-06-04 IMAGING — CT CT ABD-PELV W/O CM
2 of 4 series · 16 of 46 positions shown, 18 images · non-contrast
Comparison: None.

CLINICAL DATA: No BM since [REDACTED]. Decreased appetite. Abdominal
distention.

EXAM:
CT ABDOMEN AND PELVIS WITHOUT CONTRAST
TECHNIQUE: Multidetector CT imaging of the abdomen and pelvis was performed
following the standard protocol without IV contrast.

[Series 2: axial st · axial · 0.89mm/px · z∈[-412,-32]mm · 13 of 84 slices shown, 15 images]
[im 4/84  soft-tissue]
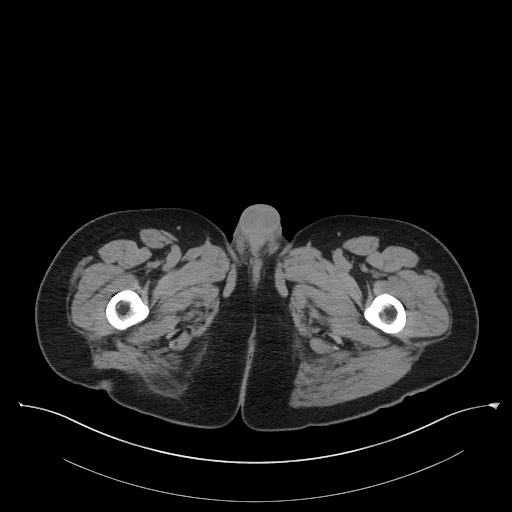
[im 4/84  bone]
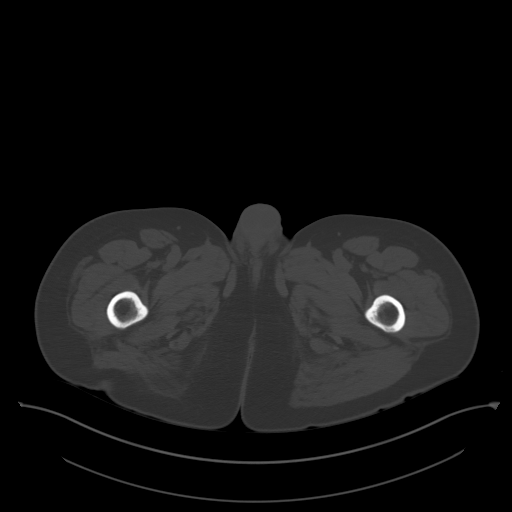
[im 10/84  soft-tissue]
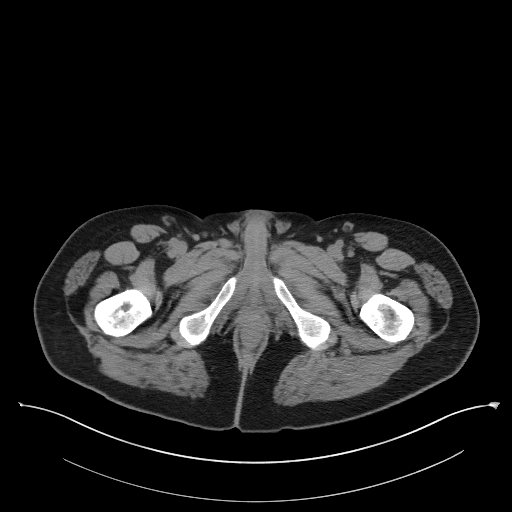
[im 16/84  soft-tissue]
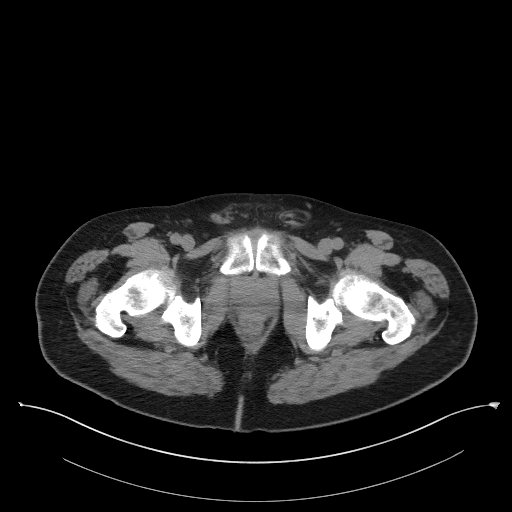
[im 23/84  soft-tissue]
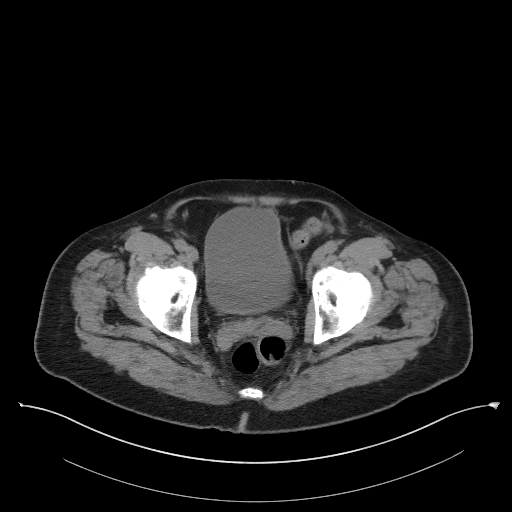
[im 29/84  soft-tissue]
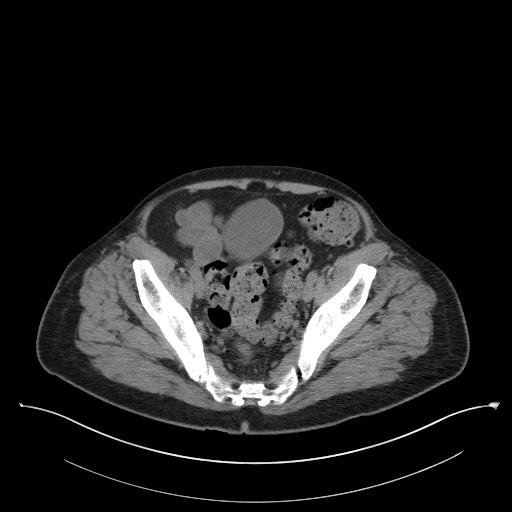
[im 36/84  soft-tissue]
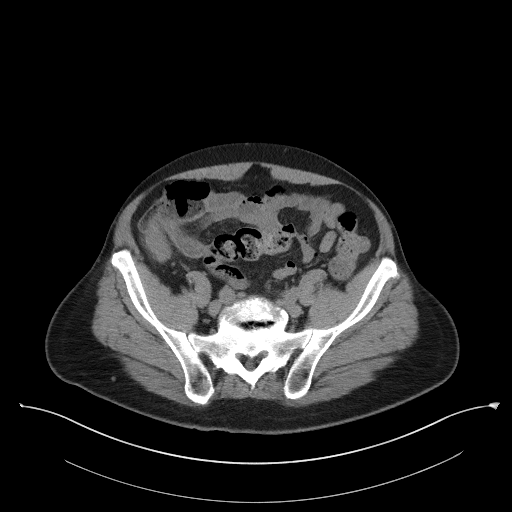
[im 42/84  soft-tissue]
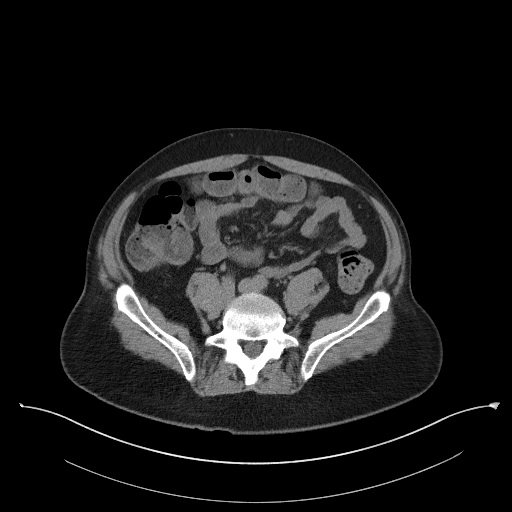
[im 48/84  soft-tissue]
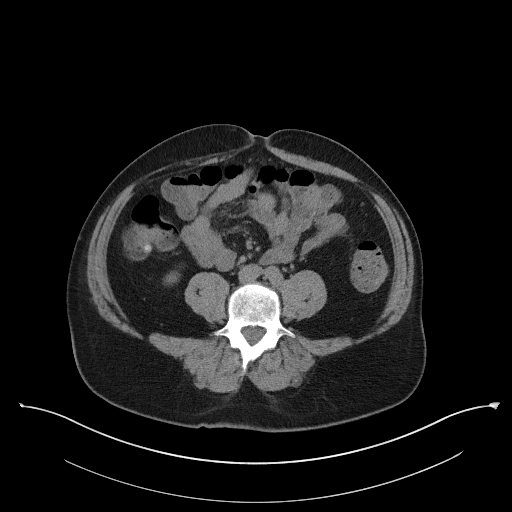
[im 55/84  soft-tissue]
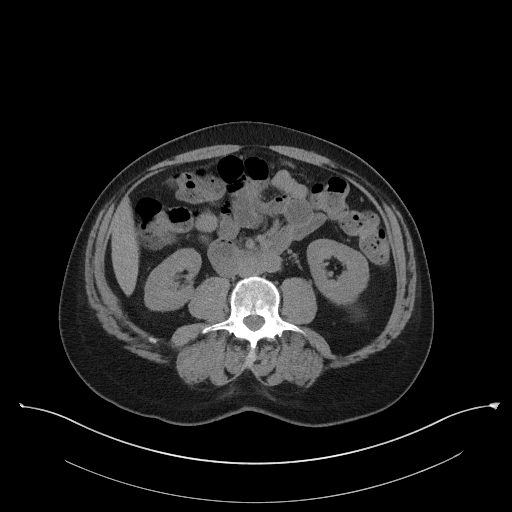
[im 55/84  bone]
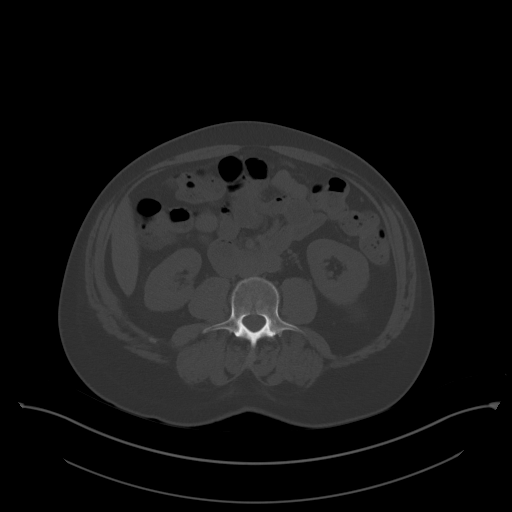
[im 61/84  soft-tissue]
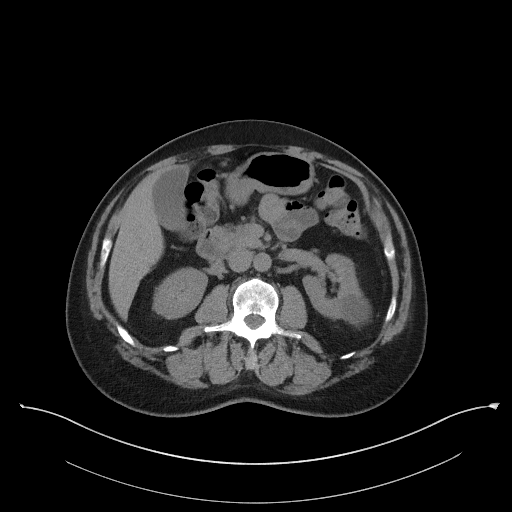
[im 68/84  soft-tissue]
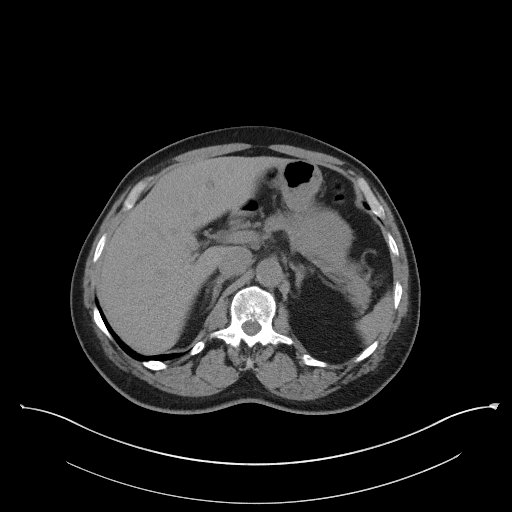
[im 74/84  soft-tissue]
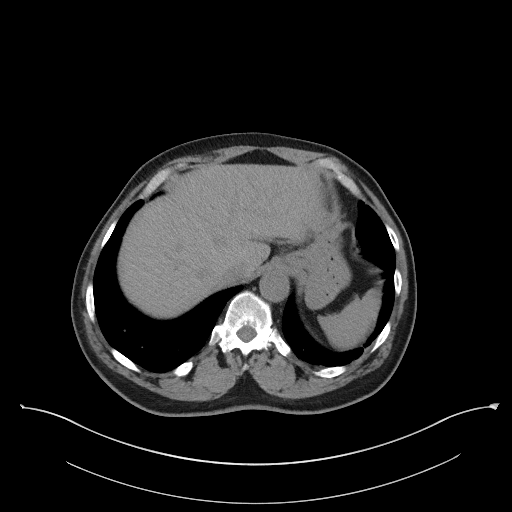
[im 80/84  soft-tissue]
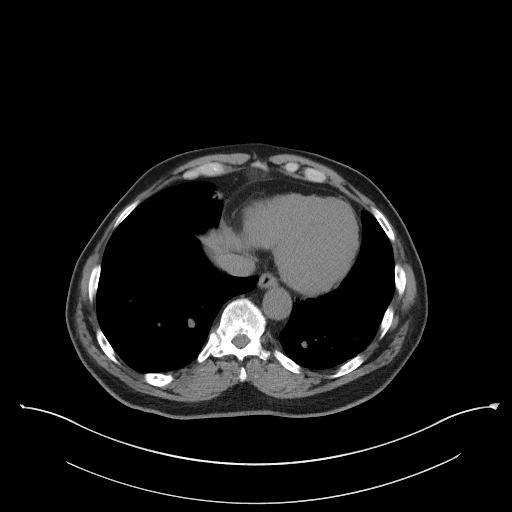

[Series 5: coronal st · coronal · 0.75mm/px · 3 of 94 slices shown]
[im 32/94  soft-tissue]
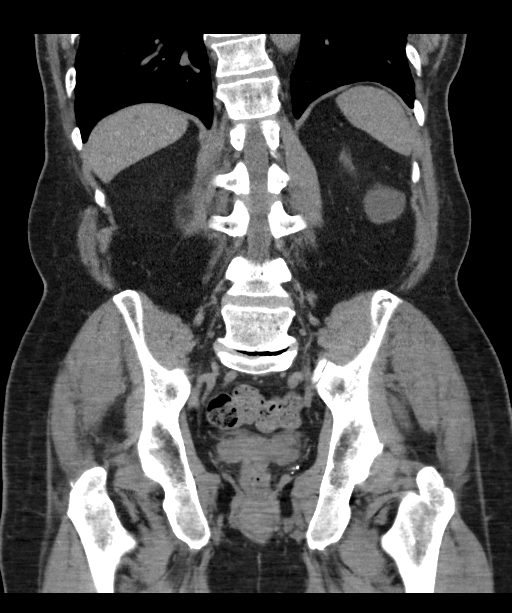
[im 42/94  soft-tissue]
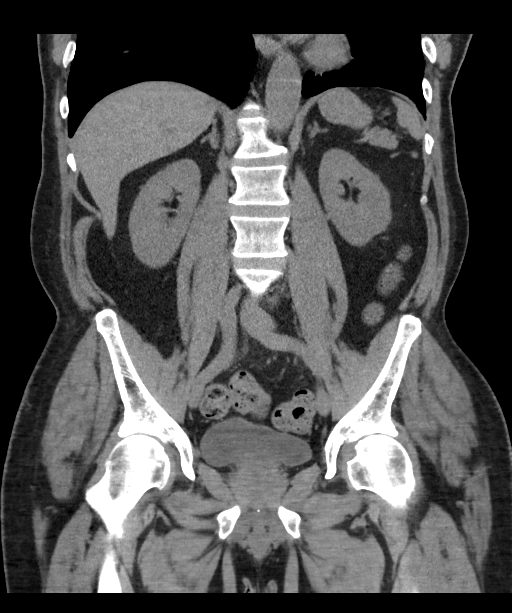
[im 52/94  soft-tissue]
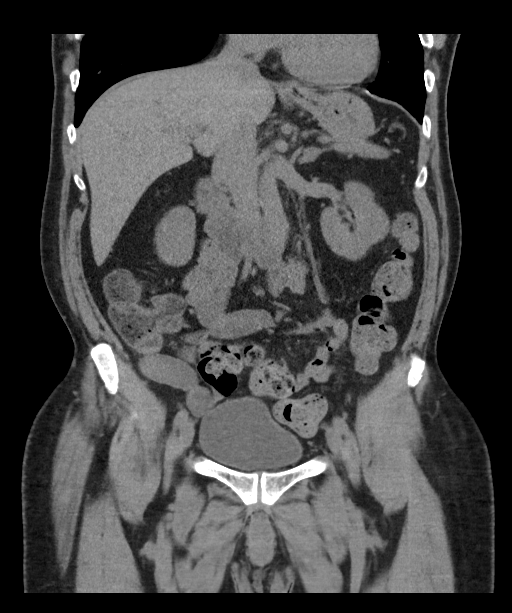

[16 of 46 positions shown; findings below may reference images not displayed]

FINDINGS: Lower chest: No acute abnormality.

Hepatobiliary: Scattered subcentimeter hypodensities in the liver,
likely cysts. Gallbladder unremarkable.

Pancreas: No focal abnormality or ductal dilatation.

Spleen: No focal abnormality.  Normal size.

Adrenals/Urinary Tract: 4 cm low-density lesion in the midpole of
the left kidney. This cannot be fully characterized on this
noncontrast study. No hydronephrosis or stones. Urinary bladder and
adrenal glands unremarkable.

Stomach/Bowel: Moderate stool throughout the colon. Small bowel and
stomach decompressed, grossly unremarkable. No evidence of bowel
obstruction.

Vascular/Lymphatic: No evidence of aneurysm or adenopathy.

Reproductive: Mildly prominent prostate.

Other: No free fluid or free air.

Musculoskeletal: No acute bony abnormality.
IMPRESSION: 4 cm low-density lesion within the midpole left kidney cannot be
fully characterized on this noncontrast study. This could be further
evaluated with contrast-enhanced CT/MRI or ultrasound if felt
clinically indicated.

Scattered hypodensities in the liver also cannot be fully
characterized on this noncontrast study but statistically most
likely reflects cysts.

No acute findings in the abdomen or pelvis.
